# Patient Record
Sex: Male | Born: 1950 | Race: White | Hispanic: No | Marital: Single | Smoking: Never smoker
Health system: Southern US, Community
[De-identification: ages and names within clinical notes are randomized; demographics above are authoritative.]

## PROBLEM LIST (undated history)

## (undated) DIAGNOSIS — E785 Hyperlipidemia, unspecified: Secondary | ICD-10-CM

## (undated) DIAGNOSIS — R21 Rash and other nonspecific skin eruption: Secondary | ICD-10-CM

## (undated) DIAGNOSIS — N200 Calculus of kidney: Secondary | ICD-10-CM

## (undated) DIAGNOSIS — C801 Malignant (primary) neoplasm, unspecified: Secondary | ICD-10-CM

## (undated) DIAGNOSIS — Z860100 Personal history of colon polyps, unspecified: Secondary | ICD-10-CM

## (undated) DIAGNOSIS — B159 Hepatitis A without hepatic coma: Secondary | ICD-10-CM

## (undated) DIAGNOSIS — I350 Nonrheumatic aortic (valve) stenosis: Secondary | ICD-10-CM

## (undated) DIAGNOSIS — N419 Inflammatory disease of prostate, unspecified: Secondary | ICD-10-CM

## (undated) DIAGNOSIS — M549 Dorsalgia, unspecified: Secondary | ICD-10-CM

## (undated) DIAGNOSIS — I10 Essential (primary) hypertension: Secondary | ICD-10-CM

## (undated) HISTORY — DX: Essential (primary) hypertension: I10

## (undated) HISTORY — DX: Dorsalgia, unspecified: M54.9

## (undated) HISTORY — DX: Inflammatory disease of prostate, unspecified: N41.9

## (undated) HISTORY — DX: Hyperlipidemia, unspecified: E78.5

## (undated) HISTORY — DX: Rash and other nonspecific skin eruption: R21

## (undated) HISTORY — DX: Hepatitis a without hepatic coma: B15.9

## (undated) HISTORY — PX: OTHER SURGICAL HISTORY: SHX169

## (undated) HISTORY — DX: Nonrheumatic aortic (valve) stenosis: I35.0

## (undated) HISTORY — DX: Personal history of colonic polyps: Z86.010

## (undated) HISTORY — DX: Calculus of kidney: N20.0

## (undated) HISTORY — DX: Personal history of colon polyps, unspecified: Z86.0100

---

## 2004-05-12 ENCOUNTER — Encounter: Admission: RE | Admit: 2004-05-12 | Discharge: 2004-05-12 | Payer: Self-pay | Admitting: Internal Medicine

## 2007-04-26 ENCOUNTER — Ambulatory Visit: Payer: Self-pay | Admitting: Internal Medicine

## 2007-04-26 LAB — CONVERTED CEMR LAB
Albumin: 3.7 g/dL (ref 3.5–5.2)
Basophils Absolute: 0.1 10*3/uL (ref 0.0–0.1)
Bilirubin, Direct: 0.2 mg/dL (ref 0.0–0.3)
Calcium: 8.6 mg/dL (ref 8.4–10.5)
Cholesterol: 210 mg/dL (ref 0–200)
Eosinophils Absolute: 0.2 10*3/uL (ref 0.0–0.6)
Eosinophils Relative: 1.6 % (ref 0.0–5.0)
GFR calc Af Amer: 112 mL/min
GFR calc non Af Amer: 92 mL/min
Glucose, Bld: 122 mg/dL — ABNORMAL HIGH (ref 70–99)
HDL: 24.9 mg/dL — ABNORMAL LOW (ref >39.0)
Hemoglobin, Urine: NEGATIVE
Lymphocytes Relative: 30.5 % (ref 12.0–46.0)
MCHC: 34.7 g/dL (ref 30.0–36.0)
MCV: 85.6 fL (ref 78.0–100.0)
Neutro Abs: 5.9 10*3/uL (ref 1.4–7.7)
Neutrophils Relative %: 57.4 % (ref 43.0–77.0)
Nitrite: NEGATIVE
PSA: 0.99 ng/mL (ref 0.10–4.00)
Platelets: 327 10*3/uL (ref 150–400)
Sodium: 129 meq/L — ABNORMAL LOW (ref 135–145)
TSH: 3.05 microintl units/mL (ref 0.35–5.50)
Total CHOL/HDL Ratio: 8.4
Triglycerides: 363 mg/dL (ref 0–149)
Urine Glucose: NEGATIVE mg/dL
WBC: 10.4 10*3/uL (ref 4.5–10.5)

## 2007-05-05 ENCOUNTER — Ambulatory Visit: Payer: Self-pay | Admitting: Internal Medicine

## 2007-05-06 ENCOUNTER — Encounter: Payer: Self-pay | Admitting: Internal Medicine

## 2007-05-06 DIAGNOSIS — H9319 Tinnitus, unspecified ear: Secondary | ICD-10-CM | POA: Insufficient documentation

## 2007-05-06 DIAGNOSIS — Z860100 Personal history of colon polyps, unspecified: Secondary | ICD-10-CM | POA: Insufficient documentation

## 2007-05-06 DIAGNOSIS — J309 Allergic rhinitis, unspecified: Secondary | ICD-10-CM | POA: Insufficient documentation

## 2007-05-06 DIAGNOSIS — E782 Mixed hyperlipidemia: Secondary | ICD-10-CM

## 2007-05-06 DIAGNOSIS — B159 Hepatitis A without hepatic coma: Secondary | ICD-10-CM | POA: Insufficient documentation

## 2007-05-06 DIAGNOSIS — I10 Essential (primary) hypertension: Secondary | ICD-10-CM

## 2007-05-06 DIAGNOSIS — Z87442 Personal history of urinary calculi: Secondary | ICD-10-CM

## 2008-07-09 ENCOUNTER — Encounter (INDEPENDENT_AMBULATORY_CARE_PROVIDER_SITE_OTHER): Payer: Self-pay | Admitting: *Deleted

## 2008-07-09 ENCOUNTER — Ambulatory Visit: Payer: Self-pay | Admitting: Internal Medicine

## 2008-07-09 LAB — CONVERTED CEMR LAB
AST: 21 units/L (ref 0–37)
Alkaline Phosphatase: 60 units/L (ref 39–117)
Basophils Absolute: 0 10*3/uL (ref 0.0–0.1)
Basophils Relative: 0.4 % (ref 0.0–3.0)
CO2: 31 meq/L (ref 19–32)
Calcium: 9.1 mg/dL (ref 8.4–10.5)
Cholesterol: 225 mg/dL (ref 0–200)
Creatinine, Ser: 1 mg/dL (ref 0.4–1.5)
Glucose, Bld: 81 mg/dL (ref 70–99)
HCT: 49.4 % (ref 39.0–52.0)
Hemoglobin: 17 g/dL (ref 13.0–17.0)
Ketones, ur: NEGATIVE mg/dL
Lymphocytes Relative: 35.9 % (ref 12.0–46.0)
MCHC: 34.4 g/dL (ref 30.0–36.0)
MCV: 88.5 fL (ref 78.0–100.0)
Neutro Abs: 4.4 10*3/uL (ref 1.4–7.7)
RBC: 5.58 M/uL (ref 4.22–5.81)
Specific Gravity, Urine: 1.01 (ref 1.000–1.03)
TSH: 2.65 microintl units/mL (ref 0.35–5.50)
Total Bilirubin: 1 mg/dL (ref 0.3–1.2)
Total Protein, Urine: NEGATIVE mg/dL
Urine Glucose: NEGATIVE mg/dL
pH: 7 (ref 5.0–8.0)

## 2008-07-13 ENCOUNTER — Ambulatory Visit: Payer: Self-pay | Admitting: Internal Medicine

## 2008-12-14 ENCOUNTER — Ambulatory Visit: Payer: Self-pay | Admitting: Internal Medicine

## 2008-12-14 DIAGNOSIS — M549 Dorsalgia, unspecified: Secondary | ICD-10-CM | POA: Insufficient documentation

## 2008-12-14 DIAGNOSIS — R21 Rash and other nonspecific skin eruption: Secondary | ICD-10-CM

## 2009-01-21 ENCOUNTER — Telehealth: Payer: Self-pay | Admitting: Internal Medicine

## 2009-07-18 ENCOUNTER — Ambulatory Visit: Payer: Self-pay | Admitting: Internal Medicine

## 2009-07-18 LAB — CONVERTED CEMR LAB
AST: 17 units/L (ref 0–37)
BUN: 13 mg/dL (ref 6–23)
Basophils Absolute: 0.1 10*3/uL (ref 0.0–0.1)
Bilirubin, Direct: 0.1 mg/dL (ref 0.0–0.3)
Calcium: 9.1 mg/dL (ref 8.4–10.5)
Cholesterol: 196 mg/dL (ref 0–200)
Creatinine, Ser: 1 mg/dL (ref 0.4–1.5)
GFR calc non Af Amer: 81 mL/min (ref >60)
Glucose, Bld: 92 mg/dL (ref 70–99)
HCT: 50.9 % (ref 39.0–52.0)
HDL: 34.3 mg/dL — ABNORMAL LOW (ref >39.00)
LDL Cholesterol: 138 mg/dL — ABNORMAL HIGH (ref 0–99)
Leukocytes, UA: NEGATIVE
Lymphs Abs: 2.6 10*3/uL (ref 0.7–4.0)
Monocytes Relative: 9.9 % (ref 3.0–12.0)
Neutrophils Relative %: 58.7 % (ref 43.0–77.0)
PSA: 1.15 ng/mL (ref 0.10–4.00)
Platelets: 305 10*3/uL (ref 150.0–400.0)
RDW: 13.4 % (ref 11.5–14.6)
Specific Gravity, Urine: 1.015 (ref 1.000–1.030)
TSH: 1.55 microintl units/mL (ref 0.35–5.50)
Total Bilirubin: 1 mg/dL (ref 0.3–1.2)
Triglycerides: 119 mg/dL (ref 0.0–149.0)
Urine Glucose: NEGATIVE mg/dL
Urobilinogen, UA: 0.2 (ref 0.0–1.0)
VLDL: 23.8 mg/dL (ref 0.0–40.0)
WBC: 9.5 10*3/uL (ref 4.5–10.5)
pH: 6.5 (ref 5.0–8.0)

## 2009-07-23 ENCOUNTER — Ambulatory Visit: Payer: Self-pay | Admitting: Internal Medicine

## 2009-07-23 DIAGNOSIS — Z87448 Personal history of other diseases of urinary system: Secondary | ICD-10-CM

## 2010-07-17 ENCOUNTER — Ambulatory Visit: Payer: Self-pay | Admitting: Internal Medicine

## 2010-07-17 LAB — CONVERTED CEMR LAB
Albumin: 3.8 g/dL (ref 3.5–5.2)
Alkaline Phosphatase: 50 units/L (ref 39–117)
Basophils Absolute: 0.1 10*3/uL (ref 0.0–0.1)
Bilirubin, Direct: 0.1 mg/dL (ref 0.0–0.3)
CO2: 30 meq/L (ref 19–32)
Calcium: 9.5 mg/dL (ref 8.4–10.5)
Cholesterol: 207 mg/dL — ABNORMAL HIGH (ref 0–200)
Creatinine, Ser: 1 mg/dL (ref 0.4–1.5)
Direct LDL: 144.5 mg/dL
Eosinophils Absolute: 0.2 10*3/uL (ref 0.0–0.7)
Glucose, Bld: 89 mg/dL (ref 70–99)
Lymphocytes Relative: 36.6 % (ref 12.0–46.0)
MCHC: 35 g/dL (ref 30.0–36.0)
Neutrophils Relative %: 50.4 % (ref 43.0–77.0)
PSA: 1.3 ng/mL (ref 0.10–4.00)
Platelets: 377 10*3/uL (ref 150.0–400.0)
RDW: 14 % (ref 11.5–14.6)
Specific Gravity, Urine: 1.01 (ref 1.000–1.030)
Total Protein, Urine: NEGATIVE mg/dL
Triglycerides: 147 mg/dL (ref 0.0–149.0)
Urine Glucose: NEGATIVE mg/dL
Urobilinogen, UA: 0.2 (ref 0.0–1.0)

## 2010-07-24 ENCOUNTER — Ambulatory Visit: Payer: Self-pay | Admitting: Internal Medicine

## 2010-07-24 ENCOUNTER — Encounter: Payer: Self-pay | Admitting: Internal Medicine

## 2010-11-04 NOTE — Assessment & Plan Note (Signed)
Summary: PHYSICAL---STC   Vital Signs:  Patient profile:   60 year old male Height:      67 inches Weight:      133 pounds BMI:     20.91 O2 Sat:      98 % on Room air Temp:     98.3 degrees F oral Pulse rate:   81 / minute BP sitting:   126 / 88  (left arm) Cuff size:   regular  Vitals Entered By: Bill Salinas CMA (July 24, 2010 1:28 PM)  O2 Flow:  Room air CC: pt here for cpx   Primary Care Provider:  Jacques Navy MD  CC:  pt here for cpx.  History of Present Illness: Preesnts for general wellness exam.  He has known cervical arthritis and this has gotten worse. When he works with weights he has had several strains involving upper trunk and arm. He has not had c-spine x-rays for several years.  Balance - he seems to stumble more. He has some numbness in his toes, worse after exercise; feet get cold.  ED-decrease volume of ejactulate, no difficulty maintaining an erection, no libido problem.  Current Medications (verified): 1)  Lisinopril-Hydrochlorothiazide 20-25 Mg  Tabs (Lisinopril-Hydrochlorothiazide) .... Take 1 Tablet By Mouth Once A Day 2)  Aspirin 81 Mg  Tabs (Aspirin) .... Take 1 Tablet By Mouth Once A Day 3)  Saw Palmetto 450 Mg Caps (Saw Palmetto (Serenoa Repens)) .... Take 1 Tablet By Mouth Once A Day 4)  Fish Oil 1200 Mg Caps (Omega-3 Fatty Acids) .... Take 1 Tablet By Mouth Once A Day 5)  Multivitamins   Tabs (Multiple Vitamin) .... Take 1 Tablet By Mouth Once A Day 6)  Hydroxyzine Hcl 25 Mg Tabs (Hydroxyzine Hcl) .... As Directed As Needed 7)  Doxepin Hcl 25 Mg Caps (Doxepin Hcl) .... Take 1 Tab By Mouth At Bedtime As Needed 8)  Nortriptyline Hcl 25 Mg Caps (Nortriptyline Hcl) .... As Directed As Needed 9)  Niacin Cr 250 Mg Cr-Caps (Niacin) .Marland Kitchen.. 1 By Mouth Once Daily 10)  Vitamin D3 1000 Unit Tabs (Cholecalciferol) .Marland Kitchen.. 1 By Mouth Once Daily 11)  Flexeril 5 Mg Tabs (Cyclobenzaprine Hcl) .Marland Kitchen.. 1 By Mouth Three Times A Day As Needed - Needs Return  Office Visit With Dr Christen Butter For Further Refills 12)  Diclofenac Sodium 75 Mg Tbec (Diclofenac Sodium) .Marland Kitchen.. 1 By Mouth Two Times A Day As Needed 13)  Cyclobenzaprine Hcl 10 Mg Tabs (Cyclobenzaprine Hcl) .... Prn 14)  Doxycycline Hyclate 50 Mg Caps (Doxycycline Hyclate) .... Prn 15)  Glucosamine 500 Mg Caps (Glucosamine Sulfate) .Marland Kitchen.. 1 Capsule Two Times A Day  Allergies (verified): 1)  ! Pcn 2)  ! 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) COLONIC POLYPS, HX OF (ICD-V12.72) NEPHROLITHIASIS, HX OF (ICD-V13.01) HYPERTENSION (ICD-401.9) ALLERGIC RHINITIS (ICD-477.9)  Past Surgical History: Last updated: 08/03/2009 ORIF wrist-'05 correction deviated septum '05 ureteroscopyp-in his 20's  Family History: Last updated: 2009-08-03 mother - deceased  47: CHF, lipids, HTN, CVA father - deceased @ 2: c/o DM, CVA, ESRD-HD, amputee  Family History Hypertension Neg- colon or prostate; MI  Social History: Last updated: Aug 03, 2009 [employer]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, 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    Physical Exam  General:  slender white  male in no distress Head:  Normocephalic and atraumatic without obvious abnormalities. No apparent alopecia or balding. Eyes:  No corneal or conjunctival inflammation noted. EOMI. Perrla. Funduscopic exam benign, without hemorrhages, exudates or papilledema. Vision grossly normal. Ears:  External ear exam shows no significant lesions or deformities.  Otoscopic examination reveals clear canals, tympanic membranes are intact bilaterally without bulging, retraction, inflammation or discharge. Hearing is grossly normal bilaterally. Nose:  no external deformity and no external erythema.   Mouth:  Oral mucosa and oropharynx without lesions or exudates.  Teeth in good repair. Neck:  supple, full ROM, no thyromegaly, and no carotid bruits.   Chest Wall:  no deformities and no masses.   Lungs:  Normal respiratory effort, chest expands symmetrically. Lungs are clear to auscultation, no crackles or wheezes. Heart:  Normal rate and regular rhythm. S1 and S2 normal without gallop, murmur, click, rub or other extra sounds. Abdomen:  soft, non-tender, normal bowel sounds, no masses, no guarding, and no hepatomegaly.   Rectal:  No external abnormalities noted. Normal sphincter tone. No rectal masses or tenderness. Genitalia:  no hydrocele, no varicocele, no scrotal masses, and no testicular masses or atrophy.   Prostate:  Prostate gland firm and smooth, no enlargement, nodularity, tenderness, mass, asymmetry or induration. Msk:  normal ROM, no joint tenderness, no joint swelling, no joint warmth, and no joint instability.   Pulses:  2+ radial and DP Extremities:  No clubbing, cyanosis, edema, or deformity noted with normal full range of motion of all joints.   Neurologic:  alert & oriented X3, cranial nerves II-XII intact, strength normal in all extremities, gait normal, and DTRs symmetrical and normal.   Skin:  turgor normal, color normal, no rashes, no suspicious lesions, and no ulcerations.   Cervical Nodes:   no anterior cervical adenopathy and no posterior cervical adenopathy.   Inguinal Nodes:  no R inguinal adenopathy and no L inguinal adenopathy.   Psych:  Oriented X3, memory intact for recent and remote, normally interactive, good eye contact, and not anxious appearing.     Impression & Recommendations:  Problem # 1:  HYPERLIPIDEMIA, MIXED (ICD-272.2) Patient on no statin drugs using only otc niacin.  Plan - lab with recommnedations to follow.  His updated medication list for this problem includes:    Niacin Cr 250 Mg Cr-caps (Niacin) .Marland Kitchen... 1 by mouth once daily  Addendum - LDL mildly elevated at 144.5, with NCEP treatment threshold of 160.  Plan - life-style management with a low fat diet and regular aerobic exercise.           Repeat lipid profile in 6 months.  Problem # 2:  HYPERTENSION (ICD-401.9)  His updated medication list for this problem includes:    Lisinopril-hydrochlorothiazide 20-25 Mg Tabs (Lisinopril-hydrochlorothiazide) .Marland Kitchen... Take 1 tablet by mouth once a day  BP today: 126/88 Prior BP: 100/64 (07/23/2009)  Good control on present medications. Will continue the same.  Problem # 3:  BACK PAIN (ICD-724.5)  Patient with upper back pain. No radicular findings on exam.  Plan - evaluate for degenerative changes cervical spine  continue present medications as needed.  His updated medication list for this problem includes:    Aspirin 81 Mg Tabs (Aspirin) .Marland Kitchen... Take 1 tablet by mouth once a day    Flexeril 5 Mg Tabs (Cyclobenzaprine hcl) .Marland Kitchen... 1 by mouth three times a day as needed - needs return office visit with dr Christen Butter for further refills    Diclofenac Sodium 75 Mg Tbec (Diclofenac sodium) .Marland Kitchen... 1 by mouth two times a day as needed    Cyclobenzaprine Hcl 10 Mg Tabs (Cyclobenzaprine hcl) .Marland Kitchen... Prn  G CERVICAL SPINE WITH FLEX & EXTEND - [id number]   Clinical Data: Right side neck pain.   CERVICAL SPINE COMPLETE WITH FLEXION AND EXTENSION VIEWS     Comparison: None.   Findings: There is marked loss of disc space height at C3-4, C5-6 and C6-7.  Mild anterolisthesis of C4 on C5 of 0.4 cm is seen with flexion which reduces with extension.  Mild retrolisthesis of C3 on C4 is unchanged with flexion or extension.  Vertebral body height is maintained.  The patient has multilevel uncovertebral disease. Prevertebral soft tissues appear normal.  Lung apices are clear.   IMPRESSION:   1.  No acute finding. 2.  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   Read By:  Charyl Dancer,  M.D.  Orders: T-Cervical Spine Comp w/Flex & Ext ([id number])  Problem # 4:  Preventive Health Care (ICD-V70.0) Unremarkable interval history. Normal physical exam including normal testicles. No problems with sexual function except decreased volume of ejaculate which does not require medical treatment. Lab results at within normal limits except for mild elevation in LDL cholesterol as noted. Last colonoscopy Jan '07 with follow-up due 2017. Immunizations: tetnus Jan '02, pneumonvax Oct '09 with booster due in 2014. He should check insurance coverage for shingles vaccine.12 Lead EKG negative for any signs of ischemia  In summary - a nice man who appears to be medically stable at this time. He will return as needed or in 1 year.    Complete Medication List: 1)  Lisinopril-hydrochlorothiazide 20-25 Mg Tabs (Lisinopril-hydrochlorothiazide) .... Take 1 tablet by mouth once a day 2)  Aspirin 81 Mg Tabs (Aspirin) .... Take 1 tablet by mouth once a day 3)  Saw Palmetto 450 Mg Caps (Saw palmetto (serenoa repens)) .... Take 1 tablet by mouth once a day 4)  Fish Oil 1200 Mg Caps (Omega-3 fatty acids) .... Take 1 tablet by mouth once a day 5)  Multivitamins Tabs (Multiple vitamin) .... Take 1 tablet by mouth once a day 6)  Hydroxyzine Hcl 25 Mg Tabs (Hydroxyzine hcl) .... As directed as needed 7)  Doxepin Hcl 25 Mg Caps (Doxepin hcl) .... Take  1 tab by mouth at bedtime as needed 8)  Nortriptyline Hcl 25 Mg Caps (Nortriptyline hcl) .... As directed as needed 9)  Niacin Cr 250 Mg Cr-caps (Niacin) .Marland Kitchen.. 1 by mouth once daily 10)  Vitamin D3 1000 Unit Tabs (Cholecalciferol) .Marland Kitchen.. 1 by mouth once daily 11)  Flexeril 5 Mg Tabs (Cyclobenzaprine hcl) .Marland Kitchen.. 1 by mouth three times a day as needed - needs return office visit with dr Christen Butter for further refills 12)  Diclofenac Sodium 75 Mg Tbec (Diclofenac sodium) .Marland Kitchen.. 1 by mouth two times a day as needed 13)  Cyclobenzaprine Hcl 10 Mg Tabs (Cyclobenzaprine hcl) .... Prn 14)  Doxycycline Hyclate 50 Mg Caps (Doxycycline hyclate) .... Prn 15)  Glucosamine 500 Mg Caps (Glucosamine sulfate) .Marland Kitchen.. 1 capsule two times a  day  Other Orders: Admin 1st Vaccine (19147) Flu Vaccine 85yrs + (82956) EKG w/ Interpretation (93000)   Patient: Jonathan Miranda Note: All result statuses are Final unless otherwise noted.  Tests: (1) Lipid Panel (LIPID)   Cholesterol          [H]  207 mg/dL                   2-130     ATP III Classification            Desirable:  < 200 mg/dL                    Borderline High:  200 - 239 mg/dL               High:  > = 240 mg/dL   Triglycerides             147.0 mg/dL                 8.6-578.4     Normal:  <150 mg/dL     Borderline High:  696 - 199 mg/dL   HDL                  [L]  29.52 mg/dL                 >84.13   VLDL Cholesterol          29.4 mg/dL                  2.4-40.1  CHO/HDL Ratio:  CHD Risk                             5                    Men          Women     1/2 Average Risk     3.4          3.3     Average Risk          5.0          4.4     2X Average Risk          9.6          7.1     3X Average Risk          15.0          11.0                           Tests: (2) BMP (METABOL)   Sodium               [L]  128 mEq/L                   135-145   Potassium                 4.4 mEq/L                   3.5-5.1   Chloride             [L]  90 mEq/L                     96-112   Carbon Dioxide  30 mEq/L                    19-32   Glucose                   89 mg/dL                    98-11   BUN                       9 mg/dL                     9-14   Creatinine                1.0 mg/dL                   7.8-2.9   Calcium                   9.5 mg/dL                   5.6-21.3   GFR                       82.64 mL/min                >60  Tests: (3) CBC Platelet w/Diff (CBCD)   White Cell Count          9.4 K/uL                    4.5-10.5   Red Cell Count            4.98 Mil/uL                 4.22-5.81   Hemoglobin                15.5 g/dL                   08.6-57.8   Hematocrit                44.4 %                      39.0-52.0   MCV                       89.3 fl                     78.0-100.0   MCHC                      35.0 g/dL                   46.9-62.9   RDW                       14.0 %                      11.5-14.6   Platelet Count            377.0 K/uL                  150.0-400.0   Neutrophil %              50.4 %  43.0-77.0   Lymphocyte %              36.6 %                      12.0-46.0   Monocyte %                10.6 %                      3.0-12.0   Eosinophils%              1.7 %                       0.0-5.0   Basophils %               0.7 %                       0.0-3.0   Neutrophill Absolute      4.7 K/uL                    1.4-7.7   Lymphocyte Absolute       3.4 K/uL                    0.7-4.0   Monocyte Absolute         1.0 K/uL                    0.1-1.0  Eosinophils, Absolute                             0.2 K/uL                    0.0-0.7   Basophils Absolute        0.1 K/uL                    0.0-0.1  Tests: (4) Hepatic/Liver Function Panel (HEPATIC)   Total Bilirubin           0.6 mg/dL                   5.7-8.4   Direct Bilirubin          0.1 mg/dL                   6.9-6.2   Alkaline Phosphatase      50 U/L                      39-117   AST                       23 U/L                       0-37   ALT                       18 U/L                      0-53   Total Protein             6.2 g/dL                    9.5-2.8  Albumin                   3.8 g/dL                    1.1-9.1  Tests: (5) TSH (TSH)   FastTSH                   4.14 uIU/mL                 0.35-5.50  Tests: (6) UDip Only (UDIP)   Color                     Yellow       RANGE:  Yellow;Lt. Yellow   Clarity                   CLEAR                       Clear   Specific Gravity          1.010                       1.000 - 1.030   Urine Ph                  7.0                         5.0-8.0   Protein                   NEGATIVE                    Negative   Urine Glucose             NEGATIVE                    Negative   Ketones                   NEGATIVE                    Negative   Urine Bilirubin           NEGATIVE                    Negative   Blood                     NEGATIVE                    Negative   Urobilinogen              0.2                         0.0 - 1.0   Leukocyte Esterace        NEGATIVE                    Negative   Nitrite                   NEGATIVE                    Negative  Tests: (7) Prostate Specific Antigen (PSA)   PSA-Hyb  1.30 ng/mL                  0.10-4.00  Tests: (8) Cholesterol LDL - Direct (DIRLDL)  Cholesterol LDL - Direct                             144.5 mg/dL     Optimal:  <846 mg/dL     Near or Above Optimal:  100-129 mg/dL     Borderline High:  962-952 mg/dL     High:  841-324 mg/dL     Very High:  >401 mg/dL  Orders Added: 1)  Admin 1st Vaccine [90471] 2)  Flu Vaccine 62yrs + [02725] 3)  T-Cervical Spine Comp w/Flex & Ext [72052TC] 4)  Est. Patient 40-64 years [99396] 5)  EKG w/ Interpretation [93000]   Flu Vaccine Consent Questions     Do you have a history of severe allergic reactions to this vaccine? no    Any prior history of allergic reactions to egg and/or gelatin? no    Do you have a sensitivity to the preservative  Thimersol? no    Do you have a past history of Guillan-Barre Syndrome? no    Do you currently have an acute febrile illness? no    Have you ever had a severe reaction to latex? no    Vaccine information given and explained to patient? yes    Are you currently pregnant? no    Lot Number:[id number]   Exp Date:04/04/2011   Site Given  right Deltoid [id number]

## 2011-01-04 ENCOUNTER — Other Ambulatory Visit: Payer: Self-pay | Admitting: Internal Medicine

## 2011-02-03 ENCOUNTER — Other Ambulatory Visit: Payer: Self-pay | Admitting: Internal Medicine

## 2011-02-05 ENCOUNTER — Other Ambulatory Visit: Payer: Self-pay | Admitting: Internal Medicine

## 2011-04-09 ENCOUNTER — Telehealth: Payer: Self-pay | Admitting: *Deleted

## 2011-04-09 MED ORDER — ZOSTER VACCINE LIVE 19400 UNT/0.65ML ~~LOC~~ SOLR: 0.6500 mL | Freq: Once | SUBCUTANEOUS | Status: DC

## 2011-04-09 NOTE — Telephone Encounter (Signed)
oky dokey

## 2011-04-09 NOTE — Telephone Encounter (Signed)
Pt says ins will cover zostavax at [facility]. OK for RX?

## 2011-04-09 NOTE — Telephone Encounter (Signed)
Rx sent in - left vm for pt.

## 2011-05-11 ENCOUNTER — Other Ambulatory Visit: Payer: Self-pay | Admitting: Internal Medicine

## 2011-06-14 ENCOUNTER — Other Ambulatory Visit: Payer: Self-pay | Admitting: Internal Medicine

## 2011-07-12 ENCOUNTER — Other Ambulatory Visit: Payer: Self-pay | Admitting: Internal Medicine

## 2011-07-21 ENCOUNTER — Other Ambulatory Visit (INDEPENDENT_AMBULATORY_CARE_PROVIDER_SITE_OTHER): Payer: Self-pay

## 2011-07-21 ENCOUNTER — Other Ambulatory Visit: Payer: Self-pay | Admitting: Internal Medicine

## 2011-07-21 ENCOUNTER — Other Ambulatory Visit: Payer: Self-pay

## 2011-07-21 DIAGNOSIS — Z0000 Encounter for general adult medical examination without abnormal findings: Secondary | ICD-10-CM

## 2011-07-21 LAB — BASIC METABOLIC PANEL
CO2: 28 mEq/L (ref 19–32)
Chloride: 88 mEq/L — ABNORMAL LOW (ref 96–112)
GFR: 101.17 mL/min (ref >60.00)
Glucose, Bld: 89 mg/dL (ref 70–99)
Potassium: 4.7 mEq/L (ref 3.5–5.1)
Sodium: 125 mEq/L — ABNORMAL LOW (ref 135–145)

## 2011-07-21 LAB — CBC WITH DIFFERENTIAL/PLATELET
Basophils Relative: 0.4 % (ref 0.0–3.0)
Eosinophils Absolute: 0.2 10*3/uL (ref 0.0–0.7)
HCT: 45.4 % (ref 39.0–52.0)
Hemoglobin: 15.3 g/dL (ref 13.0–17.0)
Lymphocytes Relative: 34.4 % (ref 12.0–46.0)
MCHC: 33.7 g/dL (ref 30.0–36.0)
Monocytes Relative: 12.3 % — ABNORMAL HIGH (ref 3.0–12.0)
Neutro Abs: 4.7 10*3/uL (ref 1.4–7.7)
RBC: 5.09 Mil/uL (ref 4.22–5.81)

## 2011-07-21 LAB — HEPATIC FUNCTION PANEL
ALT: 26 U/L (ref 0–53)
AST: 28 U/L (ref 0–37)
Albumin: 4.1 g/dL (ref 3.5–5.2)
Alkaline Phosphatase: 61 U/L (ref 39–117)

## 2011-07-21 LAB — LIPID PANEL: VLDL: 22.8 mg/dL (ref 0.0–40.0)

## 2011-07-21 LAB — LDL CHOLESTEROL, DIRECT: Direct LDL: 159.3 mg/dL

## 2011-07-24 ENCOUNTER — Encounter: Payer: Self-pay | Admitting: Internal Medicine

## 2011-07-27 ENCOUNTER — Ambulatory Visit (INDEPENDENT_AMBULATORY_CARE_PROVIDER_SITE_OTHER): Payer: BC Managed Care – PPO | Admitting: Internal Medicine

## 2011-07-27 ENCOUNTER — Other Ambulatory Visit (INDEPENDENT_AMBULATORY_CARE_PROVIDER_SITE_OTHER): Payer: BC Managed Care – PPO

## 2011-07-27 VITALS — BP 118/78 | HR 80 | Temp 97.2°F | Wt 132.0 lb

## 2011-07-27 DIAGNOSIS — R270 Ataxia, unspecified: Secondary | ICD-10-CM

## 2011-07-27 DIAGNOSIS — R279 Unspecified lack of coordination: Secondary | ICD-10-CM

## 2011-07-27 DIAGNOSIS — Z87442 Personal history of urinary calculi: Secondary | ICD-10-CM

## 2011-07-27 DIAGNOSIS — Z0000 Encounter for general adult medical examination without abnormal findings: Secondary | ICD-10-CM

## 2011-07-27 DIAGNOSIS — R319 Hematuria, unspecified: Secondary | ICD-10-CM

## 2011-07-27 DIAGNOSIS — I10 Essential (primary) hypertension: Secondary | ICD-10-CM

## 2011-07-27 DIAGNOSIS — E782 Mixed hyperlipidemia: Secondary | ICD-10-CM

## 2011-07-27 DIAGNOSIS — J309 Allergic rhinitis, unspecified: Secondary | ICD-10-CM

## 2011-07-27 LAB — VITAMIN B12: Vitamin B-12: 735 pg/mL (ref 211–911)

## 2011-07-27 NOTE — Progress Notes (Signed)
Subjective:    Patient ID: Jonathan Miranda, male    DOB: [birth year], 60 y.o.   MRN: [id number]  HPI Jonathan Miranda presaents for routine annual exam. His primary problem is arthritis for which he takes diclofenac and flexeril. His primary location is neck and shoulders. Reviewed images and reports of c-spine series - spondylosis C3-4,4-5,5-6 with mild DDD. He reports that he is having balance and gait problems especially on uneven ground. He also reports a episode hematuria that was painless. He otherwise has been doing well.   Past Medical History  Diagnosis Date  . Prostatitis   . Rash   . Back pain   . Hyperlipidemia   . Hep A w/o coma   . Tinnitus   . Personal history of colonic polyps   . Nephrolithiasis   . Hypertension   . Allergic rhinitis    Past Surgical History  Procedure Date  . Orif wrist- '05   . Correction deviated septum   . Ureteroscopyp    Family History  Problem Relation Age of Onset  . Heart disease Mother   . Hyperlipidemia Mother   . Hypertension Mother   . Diabetes Father    History   Social History  . Marital Status: Single    Spouse Name: N/A    Number of Children: N/A  . Years of Education: N/A   Occupational History  . Not on file.   Social History Main Topics  . Smoking status: Not on file  . Smokeless tobacco: Not on file  . Alcohol Use:   . Drug Use:   . Sexually Active:    Other Topics Concern  . 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  HEENT:  Negative for hearing loss, ear pain, congestion,  and postnasal drip. Negative for sore throat or swallowing problems. Negative for dental complaints.   Eyes: Negative for vision loss or change in visual acuity.  Respiratory: Negative for chest  tightness and wheezing. Negative for DOE.   Cardiovascular: Negative for chest pain or palpitations. No decreased exercise tolerance Gastrointestinal: No change in bowel habit. No bloating or gas. No reflux or indigestion Genitourinary: Negative for urgency, frequency, flank pain and difficulty urinating. Reports single episode of painless hematuria Musculoskeletal: Negative for myalgias, back pain, arthralgias. Positive for neck pain and gait problem.  Neurological: Negative for dizziness, tremors, weakness and headaches.  Hematological: Negative for adenopathy.  Psychiatric/Behavioral: Negative for behavioral problems and dysphoric mood.       Objective:   Physical Exam Vital signs reviewed Gen'l: Well nourished well developed white male in no acute distress  HEENT: Head: Normocephalic and atraumatic. Right Ear: External ear normal. EAC/TM nl. Left Ear: External ear normal.  EAC/TM nl. Nose: Nose normal. Mouth/Throat: Oropharynx is clear and moist. Dentition - native, in good repair. No buccal or palatal lesions. Posterior pharynx clear. Eyes: Conjunctivae and sclera clear. EOM intact. Pupils are equal, round, and reactive to light. Right eye exhibits no discharge. Left eye exhibits no discharge. Neck: Normal range of motion. Neck supple. No JVD present. No tracheal deviation present. No thyromegaly present.  Cardiovascular: Normal rate, regular rhythm, no gallop, no friction rub, no murmur heard.      Quiet precordium. 2+ radial and DP pulses . No carotid  bruits Pulmonary/Chest: Effort normal. No respiratory distress or increased WOB, no wheezes, no rales. No chest wall deformity or CVAT. Abdominal: Soft. Bowel sounds are normal in all quadrants. He exhibits no distension, no tenderness, no rebound or guarding, No heptosplenomegaly  Genitourinary:  deferred Musculoskeletal: Normal range of motion. He exhibits no edema and no tenderness.       Small and large joints without redness, synovial  thickening or deformity. Full range of motion preserved about all small, median and large joints.  Lymphadenopathy:    He has no cervical or supraclavicular adenopathy.  Neurological: He is alert and oriented to person, place, and time. CN II-XII intact. DTRs 2+ and symmetrical biceps, radial and patellar tendons. Cerebellar function normal with no tremor, rigidity, normal gait and station.  Skin: Skin is warm and dry. No rash noted. No erythema.  Psychiatric: He has a normal mood and affect. His behavior is normal. Thought content normal.   Lab Results  Component Value Date   WBC 9.3 07/21/2011   HGB 15.3 07/21/2011   HCT 45.4 07/21/2011   PLT 388.0 07/21/2011   GLUCOSE 89 07/21/2011   CHOL 222* 07/21/2011   TRIG 114.0 07/21/2011   HDL 41.00 07/21/2011   LDLDIRECT 159.3 07/21/2011   LDLCALC 138* 07/18/2009   ALT 26 07/21/2011   AST 28 07/21/2011   NA 125* 07/21/2011   K 4.7 07/21/2011   CL 88* 07/21/2011   CREATININE 0.8 07/21/2011   BUN 12 07/21/2011   CO2 28 07/21/2011   TSH 2.86 07/21/2011   PSA 1.23 07/21/2011            Assessment & Plan:

## 2011-07-28 ENCOUNTER — Encounter: Payer: Self-pay | Admitting: Internal Medicine

## 2011-07-28 ENCOUNTER — Telehealth: Payer: Self-pay | Admitting: Internal Medicine

## 2011-07-28 DIAGNOSIS — Z0000 Encounter for general adult medical examination without abnormal findings: Secondary | ICD-10-CM | POA: Insufficient documentation

## 2011-07-28 NOTE — Assessment & Plan Note (Signed)
Lipid panel with elevated LDL cholesterol. Discussed with the patient and he prefers to not take medication due to previous intolerance of a variety of drugs, both statins and others. He has a low cardiac risk profile. He does understand that untreated this does constitute a risk factor.  Plan - life-style management with close attention to a low fat diet and a need for increased aerobic exercise.

## 2011-07-28 NOTE — Assessment & Plan Note (Signed)
By x-ray, both report and images reviewed, he has significant DJD and DDD. He is having neck pain which is intermittent. He is also having gait and balance problems, especially on an uneven surface. ON exam he is unable to perform tandem gait. Suspect this is of a cervical origin, possibly spinal stenosis C-spine.  Plan - MRI cervical spine to r/o spinal stenosis           Referral to [facility] - Rx provided and he is to make his own appointment.

## 2011-07-28 NOTE — Assessment & Plan Note (Signed)
BP Readings from Last 3 Encounters:  07/27/11 118/78  07/24/10 126/88  07/23/09 100/64   Good control on ACE/HCT. Reviewed labs going back several years - he has a consistently low sodium. He tolerates this without symptoms. This may be related to the diuretic in his regimen. With no change in sodium level and without symptoms will make no change in medical regimen.

## 2011-07-28 NOTE — Assessment & Plan Note (Signed)
Interval hisotry significant for neck pain, gait problems and hematuria. Physical exam is unremarkable. Lab results notable for sodium of 125 (low), LDL 159, B12 normal. He is current with colorectal cancer screening due for follow-up study in 2017. Immunizations: Tetanus Jan '02 - due; shingles vaccine current Oct '11; pneumonia vaccine - due; flu shot today. 12 lead EKG w/o ischemia or injury.  In summary - a nice man who appears to be doing well but does need to address hematuria (referral in process). He also has chosen life-style management only for hyperlipidemia and will need a careful low fat diet and increased aerobic exercise. He will be notified of his MRI C-spine when results available. He will return as needed or in one year.

## 2011-07-28 NOTE — Assessment & Plan Note (Signed)
C/o post-nasal drainage, on exam no evidence of infection.  Plan - otc non-sedating antihistamine

## 2011-07-28 NOTE — Telephone Encounter (Signed)
Call pt B12 - normal

## 2011-07-28 NOTE — Assessment & Plan Note (Signed)
Patient with a h/o nephrolithiasis and he reports that he has had stones without pain. He does report a single episode of painless hematuria. At his age he is a candidate for urologic evaluation to r/o bladder neoplasm or other  Pathologic cause of hematuria.  Plan - referral to urology

## 2011-07-29 NOTE — Telephone Encounter (Signed)
Pt informed B12 normal

## 2011-08-06 ENCOUNTER — Other Ambulatory Visit: Payer: Self-pay | Admitting: Internal Medicine

## 2011-08-13 ENCOUNTER — Ambulatory Visit
Admission: RE | Admit: 2011-08-13 | Discharge: 2011-08-13 | Disposition: A | Discharge status: Home / Self Care | Payer: BC Managed Care – PPO | Source: Ambulatory Visit | Attending: Internal Medicine | Admitting: Internal Medicine

## 2011-08-13 DIAGNOSIS — R270 Ataxia, unspecified: Secondary | ICD-10-CM

## 2011-09-15 ENCOUNTER — Other Ambulatory Visit: Payer: Self-pay | Admitting: Internal Medicine

## 2011-09-30 ENCOUNTER — Other Ambulatory Visit: Payer: Self-pay | Admitting: *Deleted

## 2011-09-30 MED ORDER — DICLOFENAC SODIUM 75 MG PO TBEC: 75.0000 mg | DELAYED_RELEASE_TABLET | Freq: Two times a day (BID) | ORAL | Status: DC

## 2012-01-07 ENCOUNTER — Ambulatory Visit (INDEPENDENT_AMBULATORY_CARE_PROVIDER_SITE_OTHER): Payer: BC Managed Care – PPO | Admitting: Internal Medicine

## 2012-01-07 ENCOUNTER — Encounter: Payer: Self-pay | Admitting: Internal Medicine

## 2012-01-07 VITALS — BP 132/88 | HR 89 | Temp 98.3°F | Resp 16 | Wt 136.0 lb

## 2012-01-07 DIAGNOSIS — Z4802 Encounter for removal of sutures: Secondary | ICD-10-CM

## 2012-01-10 NOTE — Progress Notes (Signed)
  Subjective:    Patient ID: Jonathan Miranda, male    DOB: 09/01/1951, 61 y.o.   MRN: [id number]  HPI Jonathan Miranda was at [facility] and took a fall sustaining a laceration to the frontal scalp and a stellate laceration to the left occiput. He was seen at an ED in [place], [place]. Evaluation was negative. The front scalp lac was clued closed. The occipital gash was sutured with 4  Interrupted sutures [place] ing 4-0 ethilon. He has has done well and presents today for suture removal. He denies headache, change in vision or other neuro symptoms.  PMH, FamHx and SocHx reviewed for any changes and relevance.  Current Outpatient Prescriptions on File Prior to Visit  Medication Sig Dispense Refill  . cyclobenzaprine (FLEXERIL) 10 MG tablet TAKE 1 TABLET BY MOUTH THREE TIMES DAILY AS NEEDED  60 tablet  2  . diclofenac (VOLTAREN) 75 MG EC tablet Take 1 tablet (75 mg total) by mouth 2 (two) times daily.  60 tablet  2  . doxycycline (VIBRAMYCIN) 100 MG capsule Take 100 mg by mouth as needed.        . hydrOXYzine (ATARAX) 25 MG tablet TAKE AS DIRECTED AS NEEDED  30 tablet  0  . lisinopril-hydrochlorothiazide (PRINZIDE,ZESTORETIC) 20-25 MG per tablet TAKE 1 TABLET BY MOUTH EVERY DAY  30 tablet  5      Review of Systems System review is negative for any constitutional, cardiac, pulmonary, GI or neuro symptoms or complaints other than as described in the HPI.     Objective:   Physical Exam Filed Vitals:   01/07/12 1145  BP: 132/88  Pulse: 89  Temp: 98.3 F (36.8 C)  Resp: 16   Derm - well healed laceration left frontal scalp. Healed stellate laceration occiput with mild amount of eschar/coagulum. 4 sutures identified and removed without difficulty.        Assessment & Plan:  Suture removal - uncomplicate. Wound looks ok, w/o signs of infection.

## 2012-02-09 ENCOUNTER — Other Ambulatory Visit: Payer: Self-pay | Admitting: *Deleted

## 2012-02-09 MED ORDER — LISINOPRIL-HYDROCHLOROTHIAZIDE 20-25 MG PO TABS: 1.0000 | ORAL_TABLET | Freq: Every day | ORAL | Status: DC

## 2012-04-05 ENCOUNTER — Other Ambulatory Visit: Payer: Self-pay | Admitting: *Deleted

## 2012-04-05 MED ORDER — DICLOFENAC SODIUM 75 MG PO TBEC: 75.0000 mg | DELAYED_RELEASE_TABLET | Freq: Two times a day (BID) | ORAL | Status: DC

## 2012-04-05 NOTE — Telephone Encounter (Signed)
Refill Rx to [facility]  voltaren

## 2012-04-25 ENCOUNTER — Other Ambulatory Visit: Payer: Self-pay | Admitting: *Deleted

## 2012-04-25 MED ORDER — HYDROXYZINE HCL 25 MG PO TABS: 25.0000 mg | ORAL_TABLET | Freq: Four times a day (QID) | ORAL | Status: DC | PRN

## 2012-04-27 ENCOUNTER — Ambulatory Visit (INDEPENDENT_AMBULATORY_CARE_PROVIDER_SITE_OTHER): Payer: BC Managed Care – PPO | Admitting: Internal Medicine

## 2012-04-27 ENCOUNTER — Encounter: Payer: Self-pay | Admitting: Internal Medicine

## 2012-04-27 VITALS — BP 120/80 | HR 89 | Temp 98.3°F | Ht 67.0 in | Wt 133.5 lb

## 2012-04-27 DIAGNOSIS — R21 Rash and other nonspecific skin eruption: Secondary | ICD-10-CM

## 2012-04-27 MED ORDER — METHYLPREDNISOLONE ACETATE 80 MG/ML IJ SUSP
120.0000 mg | Freq: Once | INTRAMUSCULAR | Status: AC
  Administered 2012-04-27: 120 mg via INTRAMUSCULAR

## 2012-04-27 MED ORDER — TRIAMCINOLONE ACETONIDE 0.1 % EX CREA
TOPICAL_CREAM | Freq: Two times a day (BID) | CUTANEOUS | Status: DC
Start: 2012-04-27 — End: 2012-09-20

## 2012-04-27 MED ORDER — HYDROXYZINE HCL 25 MG PO TABS: 25.0000 mg | ORAL_TABLET | Freq: Four times a day (QID) | ORAL | Status: DC | PRN

## 2012-04-27 MED ORDER — PREDNISONE 10 MG PO TABS: 10.0000 mg | ORAL_TABLET | Freq: Every day | ORAL | Status: DC

## 2012-04-27 NOTE — Patient Instructions (Addendum)
You had the steroid shot today Take all new medications as prescribed - the prednisone, and topical steroid cream Your hydroxyzine was refilled as you requested This treatment should drastically improve the rash and itching, but please plan to see Dr Margo Aye if it tries to re-occur

## 2012-04-30 ENCOUNTER — Encounter: Payer: Self-pay | Admitting: Internal Medicine

## 2012-04-30 NOTE — Assessment & Plan Note (Signed)
?   Etiology, d/w dermatitis - for depomedrol IM, predpack and topcial steroid, for atarax prn, as well - pt plans to f/u with dermatology, declines referral

## 2012-04-30 NOTE — Progress Notes (Signed)
  Subjective:    Patient ID: Jonathan Miranda, male    DOB: 15-Jun-1951, 61 y.o.   MRN: [id number]  HPI  Here with 1 month gradually worsening rash to post neck and occipital area of head, now with marked itching and some swelling to the post neck aspect,  Burning and itching with some scaliness to the neck aspect and flakiness, without fever, trauma, or hx of same.  Pt denies chest pain, increased sob or doe, wheezing, orthopnea, PND, increased LE swelling, palpitations, dizziness or syncope.   Pt denies polydipsia, polyuria.   Pt denies fever, wt loss, night sweats, loss of appetite, or other constitutional symptoms.  Has seen dermatology but could not be seen quickly so here today, needs refill atarax. Past Medical History  Diagnosis Date  . Prostatitis   . Rash   . Back pain   . Hyperlipidemia   . Hep A w/o coma   . Tinnitus   . Personal history of colonic polyps   . Nephrolithiasis   . Hypertension   . Allergic rhinitis    Past Surgical History  Procedure Date  . Orif wrist- '05   . Correction deviated septum   . Ureteroscopyp     reports that he has never smoked. He has never used smokeless tobacco. He reports that he does not drink alcohol or use illicit drugs. family history includes Diabetes in his father; Heart disease in his mother; Hyperlipidemia in his mother; and Hypertension in his mother. Allergies  Allergen Reactions  . Iodine     REACTION: Hives  . Penicillins     REACTION: Hives   Current Outpatient Prescriptions on File Prior to Visit  Medication Sig Dispense Refill  . cyclobenzaprine (FLEXERIL) 10 MG tablet TAKE 1 TABLET BY MOUTH THREE TIMES DAILY AS NEEDED  60 tablet  2  . diclofenac (VOLTAREN) 75 MG EC tablet Take 1 tablet (75 mg total) by mouth 2 (two) times daily.  60 tablet  6  . lisinopril-hydrochlorothiazide (PRINZIDE,ZESTORETIC) 20-25 MG per tablet Take 1 tablet by mouth daily.  30 tablet  5  . doxycycline (VIBRAMYCIN) 100 MG capsule Take 100 mg by  mouth as needed.         Review of Systems All otherwise neg per pt    Objective:   Physical Exam BP 120/80  Pulse 89  Temp 98.3 F (36.8 C) (Oral)  Ht 5\' 7"  (1.702 m)  Wt 133 lb 8 oz (60.555 kg)  BMI 20.91 kg/m2  SpO2 99% Physical Exam  VS noted Constitutional: Pt appears well-developed and well-nourished.  HENT: Head: Normocephalic.  Right Ear: External ear normal.  Left Ear: External ear normal.  Eyes: Conjunctivae and EOM are normal. Pupils are equal, round, and reactive to light.  Neck: Normal range of motion. Neck supple.  Cardiovascular: Normal rate and regular rhythm.   Pulmonary/Chest: Effort normal and breath sounds normal.  Neurological: Pt is alert. Skin: large area involving entire post neck and occipital area red/scaly/swelling without tender, fluctuance, drainage    Assessment & Plan:

## 2012-05-04 ENCOUNTER — Other Ambulatory Visit: Payer: Self-pay

## 2012-05-04 MED ORDER — CYCLOBENZAPRINE HCL 10 MG PO TABS: 10.0000 mg | ORAL_TABLET | Freq: Three times a day (TID) | ORAL | Status: DC | PRN

## 2012-07-27 ENCOUNTER — Encounter: Payer: Self-pay | Admitting: Internal Medicine

## 2012-07-27 ENCOUNTER — Other Ambulatory Visit (INDEPENDENT_AMBULATORY_CARE_PROVIDER_SITE_OTHER): Payer: BC Managed Care – PPO

## 2012-07-27 ENCOUNTER — Ambulatory Visit (INDEPENDENT_AMBULATORY_CARE_PROVIDER_SITE_OTHER): Payer: BC Managed Care – PPO | Admitting: Internal Medicine

## 2012-07-27 VITALS — BP 114/80 | HR 76 | Temp 97.6°F | Resp 16 | Wt 134.0 lb

## 2012-07-27 DIAGNOSIS — I10 Essential (primary) hypertension: Secondary | ICD-10-CM

## 2012-07-27 DIAGNOSIS — Z23 Encounter for immunization: Secondary | ICD-10-CM | POA: Insufficient documentation

## 2012-07-27 DIAGNOSIS — E782 Mixed hyperlipidemia: Secondary | ICD-10-CM

## 2012-07-27 DIAGNOSIS — Z0000 Encounter for general adult medical examination without abnormal findings: Secondary | ICD-10-CM

## 2012-07-27 LAB — COMPREHENSIVE METABOLIC PANEL
Albumin: 3.8 g/dL (ref 3.5–5.2)
Alkaline Phosphatase: 50 U/L (ref 39–117)
BUN: 13 mg/dL (ref 6–23)
CO2: 31 mEq/L (ref 19–32)
GFR: 74.18 mL/min (ref >60.00)
Glucose, Bld: 102 mg/dL — ABNORMAL HIGH (ref 70–99)
Potassium: 4 mEq/L (ref 3.5–5.1)
Total Bilirubin: 0.6 mg/dL (ref 0.3–1.2)
Total Protein: 6.7 g/dL (ref 6.0–8.3)

## 2012-07-27 LAB — LIPID PANEL
Cholesterol: 227 mg/dL — ABNORMAL HIGH (ref 0–200)
Total CHOL/HDL Ratio: 6
Triglycerides: 214 mg/dL — ABNORMAL HIGH (ref 0.0–149.0)

## 2012-07-27 NOTE — Patient Instructions (Addendum)
Blood pressure - ok to take a drug holiday - hold the blood pressure medication for 1 week and then start checking your blood pressure. You need to be 130 or less on top and less than 90 below.  Cholesterol - last year the LDL was 159 - goal is 130 or less. Plan - lab today with recommendations to follow.  Overall you seem to be doing well and are fit for the election cycle.

## 2012-07-27 NOTE — Progress Notes (Signed)
Subjective:    Patient ID: Jonathan Miranda, male    DOB: 15-Jun-1951, 61 y.o.   MRN: [id number]  HPI Mr. Riesgo presents for an annual medical exam. He has had a good year with no major illness, surgery, or injury.  He did undertake PT of back pain - he did learn several good exercises. He did see Dr. Lattie Haw - no bladder cancer but he does have gallstones. He did take a fall in the spring which required suture repair but he made a good recovery.   He has been to the dentist. He has been to the eye doctor.   Past Medical History  Diagnosis Date  . Prostatitis   . Rash   . Back pain   . Hyperlipidemia   . Hep A w/o coma   . Tinnitus   . Personal history of colonic polyps   . Nephrolithiasis   . Hypertension   . Allergic rhinitis    Past Surgical History  Procedure Date  . Orif wrist- '05   . Correction deviated septum   . Ureteroscopyp    Family History  Problem Relation Age of Onset  . Heart disease Mother   . Hyperlipidemia Mother   . Hypertension Mother   . Diabetes Father    History   Social History  . Marital Status: Single    Spouse Name: N/A    Number of Children: N/A  . Years of Education: N/A   Occupational History  . Not on file.   Social History Main Topics  . Smoking status: Never Smoker   . Smokeless tobacco: Never Used  . Alcohol Use: No  . Drug Use: No  . Sexually Active: Not on file   Other Topics Concern  . 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 cyclobenzaprine (FLEXERIL) 10 MG tablet Take 1 tablet (10 mg total) by mouth 3 (three) times daily as needed for muscle spasms.  60 tablet  2  . diclofenac (VOLTAREN) 75 MG EC tablet Take 1 tablet (75 mg total) by mouth 2 (two) times daily.  60 tablet  6  . hydrOXYzine  (ATARAX/VISTARIL) 25 MG tablet Take 1 tablet (25 mg total) by mouth every 6 (six) hours as needed for itching.  30 tablet  3  . lisinopril-hydrochlorothiazide (PRINZIDE,ZESTORETIC) 20-25 MG per tablet Take 1 tablet by mouth daily.  30 tablet  5  . triamcinolone cream (KENALOG) 0.1 % Apply topically 2 (two) times daily.  30 g  0  . doxycycline (VIBRAMYCIN) 100 MG capsule Take 100 mg by mouth as needed.        . predniSONE (DELTASONE) 10 MG tablet Take 1 tablet (10 mg total) by mouth daily.  30 tablet  0      Review of Systems System review is negative for any constitutional, cardiac, pulmonary, GI or neuro symptoms or complaints other than as described in the HPI.     Objective:   Physical Exam Filed Vitals:   07/27/12 1345  BP: 114/80  Pulse: 76  Temp: 97.6 F (36.4 C)  Resp: 16   Wt Readings from Last 3 Encounters:  07/27/12 134 lb (60.782 kg)  04/27/12 133 lb 8 oz (60.555 kg)  01/07/12 136 lb (61.689 kg)   Gen'l: Well nourished well developed white male  in no acute distress  HEENT: Head: Normocephalic and atraumatic. Right Ear: External ear normal. EAC/TM nl. Left Ear: External ear normal.  EAC/TM nl. Nose: Nose normal. Mouth/Throat: Oropharynx is clear and moist. Dentition - native, in good repair. No buccal or palatal lesions. Posterior pharynx clear. Eyes: Conjunctivae and sclera clear. EOM intact. Pupils are equal, round, and reactive to light. Right eye exhibits no discharge. Left eye exhibits no discharge. Neck: Normal range of motion. Neck supple. No JVD present. No tracheal deviation present. No thyromegaly present.  Cardiovascular: Normal rate, regular rhythm, no gallop, no friction rub, no murmur heard.      Quiet precordium. 2+ radial and DP pulses . No carotid bruits Pulmonary/Chest: Effort normal. No respiratory distress or increased WOB, no wheezes, no rales. No chest wall deformity or CVAT. Abdomen: Soft. Bowel sounds are normal in all quadrants. He exhibits no  distension, no tenderness, no rebound or guarding, No heptosplenomegaly  Genitourinary:  deferred Musculoskeletal: Normal range of motion. He exhibits no edema and no tenderness.       Small and large joints without redness, synovial thickening or deformity. Full range of motion preserved about all small, median and large joints.  Lymphadenopathy:    He has no cervical or supraclavicular adenopathy.  Neurological: He is alert and oriented to person, place, and time. CN II-XII intact. DTRs 2+ and symmetrical biceps, radial and patellar tendons. Cerebellar function normal with no tremor, rigidity, normal gait and station.  Skin: Skin is warm and dry. No rash noted. No erythema.  Psychiatric: He has a normal mood and affect. His behavior is normal. Thought content normal.   Lab Results  Component Value Date   WBC 9.3 07/21/2011   HGB 15.3 07/21/2011   HCT 45.4 07/21/2011   PLT 388.0 07/21/2011   GLUCOSE 102* 07/27/2012   CHOL 227* 07/27/2012   TRIG 214.0* 07/27/2012   HDL 37.30* 07/27/2012   LDLDIRECT 169.1 07/27/2012   LDLCALC 138* 07/18/2009   ALT 23 07/27/2012   AST 23 07/27/2012   NA 124* 07/27/2012   K 4.0 07/27/2012   CL 86* 07/27/2012   CREATININE 1.1 07/27/2012   BUN 13 07/27/2012   CO2 31 07/27/2012   TSH 2.86 07/21/2011   PSA 1.23 07/21/2011        Assessment & Plan:

## 2012-07-31 NOTE — Assessment & Plan Note (Signed)
Interval history is benign. Physical exam in normal. Lab results ok except for cholesterol levels. He is current with colorectal cancer screening. Discussed pros and cons of prostate cancer screening (USPHCTF recommendations reviewed and ACU April '13 recommendations) and he defers evaluation at this time based on normal PSA '08- '12 with last reading of 1.23 - normal and acceleration. Immunizations are up to date except he will need to return for pneumonia vaccine.  In summary - a nice man who appears to be medically stable but will need to address elevated LDL cholesterol.

## 2012-07-31 NOTE — Assessment & Plan Note (Signed)
BP Readings from Last 3 Encounters:  07/27/12 114/80  04/27/12 120/80  01/07/12 132/88   Stable on present medical regimen with normal renal function and electrolytes.  Plan  Continue present treatment regimen

## 2012-07-31 NOTE — Assessment & Plan Note (Signed)
LDL cholesterol (bad) is greater than the treatment threshold of 160+ while the HDL (good) is below goal of 40+  Plan  ROV to discuss treatment options.

## 2012-08-04 ENCOUNTER — Other Ambulatory Visit: Payer: Self-pay | Admitting: *Deleted

## 2012-08-04 MED ORDER — LISINOPRIL-HYDROCHLOROTHIAZIDE 20-25 MG PO TABS: 1.0000 | ORAL_TABLET | Freq: Every day | ORAL | Status: DC

## 2012-08-10 ENCOUNTER — Encounter: Payer: Self-pay | Admitting: Internal Medicine

## 2012-08-10 ENCOUNTER — Ambulatory Visit (INDEPENDENT_AMBULATORY_CARE_PROVIDER_SITE_OTHER): Payer: BC Managed Care – PPO | Admitting: Internal Medicine

## 2012-08-10 VITALS — BP 120/80 | HR 96 | Temp 97.6°F | Resp 16 | Wt 133.0 lb

## 2012-08-10 DIAGNOSIS — E782 Mixed hyperlipidemia: Secondary | ICD-10-CM

## 2012-08-10 MED ORDER — GEMFIBROZIL 600 MG PO TABS: 600.0000 mg | ORAL_TABLET | Freq: Two times a day (BID) | ORAL | Status: DC

## 2012-08-10 NOTE — Patient Instructions (Addendum)
Increased cholesterol: over the past three years:    2011  2012  2013 Total cholesterol  207   222  227 LDL (bad)   144   159  169  Cardiac risk calculation (NCEP ATPIII, [place])  17% risk of a cardiac event over the next 10 years = high moderate risk  Need to do something: Start gemfibrozil 600 mg  Titration plan:  1/2 tab once a day x 3; 1/2 tab AM, PM x 3 days; 1 tab AM 1/2 tab PM x 3 days then 1 tab twice a day  Follow -up: repeat lipid profile in 8 weeks.

## 2012-08-10 NOTE — Assessment & Plan Note (Addendum)
Plan - start gemfibrozil  (greater than 50% of 20 min visit spent on education and counseling)

## 2012-08-13 NOTE — Progress Notes (Signed)
  Subjective:    Patient ID: Jonathan Miranda, male    DOB: 1951-06-18, 61 y.o.   MRN: [id number]  HPI Jonathan Miranda returns to discuss abnormal lipids and need for treatment.  PMH, FamHx and SocHx reviewed for any changes and relevance.  Medications - no change since physical exam   Review of Systems System review is negative for any constitutional, cardiac, pulmonary, GI or neuro symptoms or complaints other than as described in the HPI.     Objective:   Physical Exam Filed Vitals:   08/10/12 1507  BP: 120/80  Pulse: 96  Temp: 97.6 F (36.4 C)  Resp: 16          Assessment & Plan:

## 2012-08-15 ENCOUNTER — Other Ambulatory Visit: Payer: Self-pay | Admitting: *Deleted

## 2012-08-15 MED ORDER — CYCLOBENZAPRINE HCL 10 MG PO TABS: 10.0000 mg | ORAL_TABLET | Freq: Three times a day (TID) | ORAL | Status: DC | PRN

## 2012-08-15 NOTE — Telephone Encounter (Signed)
Medication refill on cyclobenzaprine sent to [facility].

## 2012-09-20 ENCOUNTER — Ambulatory Visit (INDEPENDENT_AMBULATORY_CARE_PROVIDER_SITE_OTHER): Payer: BC Managed Care – PPO | Admitting: Internal Medicine

## 2012-09-20 ENCOUNTER — Encounter: Payer: Self-pay | Admitting: Internal Medicine

## 2012-09-20 VITALS — BP 120/82 | HR 80 | Temp 97.3°F | Resp 16 | Wt 135.0 lb

## 2012-09-20 DIAGNOSIS — H6120 Impacted cerumen, unspecified ear: Secondary | ICD-10-CM

## 2012-09-20 DIAGNOSIS — H6122 Impacted cerumen, left ear: Secondary | ICD-10-CM

## 2012-09-25 ENCOUNTER — Encounter: Payer: Self-pay | Admitting: Internal Medicine

## 2012-09-25 DIAGNOSIS — H6122 Impacted cerumen, left ear: Secondary | ICD-10-CM | POA: Insufficient documentation

## 2012-09-25 NOTE — Patient Instructions (Signed)
Cerumen Impaction  A cerumen impaction is when the wax in your ear forms a plug. This plug usually causes reduced hearing. Sometimes it also causes an earache or dizziness. Removing a cerumen impaction can be difficult and painful. The wax sticks to the ear canal. The canal is sensitive and bleeds easily. If you try to remove a heavy wax buildup with a cotton tipped swab, you may push it in further.  Irrigation with water, suction, and small ear curettes may be used to clear out the wax. If the impaction is fixed to the skin in the ear canal, ear drops may be needed for a few days to loosen the wax. People who build up a lot of wax frequently can use ear wax removal products available in your local drugstore.  SEEK MEDICAL CARE IF:    You develop an earache, increased hearing loss, or marked dizziness.  Document Released: 10/29/2004 Document Revised: 12/14/2011 Document Reviewed: 12/19/2009  [employer] Patient Information 2013 [employer].

## 2012-09-25 NOTE — Assessment & Plan Note (Signed)
Successful irrigation today He will use murine ear wax removal kit to prevent this from happening in the future

## 2012-09-25 NOTE — Progress Notes (Signed)
Subjective:    Patient ID: Jonathan Miranda, male    DOB: 07/10/1951, 61 y.o.   MRN: [id number]  Otalgia  There is pain in the left ear. This is a new problem. The current episode started 1 to 4 weeks ago. The problem occurs every few hours. The problem has been unchanged. There has been no fever. The fever has been present for less than 1 day. The pain is at a severity of 0/10. The patient is experiencing no pain. Associated symptoms include hearing loss. Pertinent negatives include no abdominal pain, coughing, diarrhea, drainage, ear discharge, headaches, neck pain, rash, rhinorrhea, sore throat or vomiting. He has tried ear drops for the symptoms. The treatment provided no relief.      Review of Systems  Constitutional: Negative.   HENT: Positive for hearing loss, ear pain and tinnitus. Negative for nosebleeds, congestion, sore throat, rhinorrhea, sneezing, mouth sores, trouble swallowing, neck pain, dental problem, voice change, postnasal drip and ear discharge.   Eyes: Negative.   Respiratory: Negative for cough.   Cardiovascular: Negative.   Gastrointestinal: Negative.  Negative for vomiting, abdominal pain and diarrhea.  Genitourinary: Negative.   Musculoskeletal: Negative.   Skin: Negative.  Negative for rash.  Neurological: Negative.  Negative for headaches.  Hematological: Negative.   Psychiatric/Behavioral: Negative.        Objective:   Physical Exam  Vitals reviewed. Constitutional: He is oriented to person, place, and time. He appears well-developed and well-nourished. No distress.  HENT:  Head: Normocephalic and atraumatic.  Right Ear: Hearing, external ear and ear canal normal.  Left Ear: Hearing, tympanic membrane and external ear normal. No lacerations. No drainage, swelling or tenderness. A foreign body (cerumen in the left ear occludes the canal) is present. No mastoid tenderness. Tympanic membrane is not injected, not scarred, not perforated, not erythematous,  not retracted and not bulging. Tympanic membrane mobility is normal.  No middle ear effusion. No hemotympanum. No decreased hearing is noted.  Mouth/Throat: Oropharynx is clear and moist and mucous membranes are normal. Mucous membranes are not pale, not dry and not cyanotic. No oropharyngeal exudate, posterior oropharyngeal edema, posterior oropharyngeal erythema or tonsillar abscesses.       I put colace in the left ear then irrigated it and the wax was removed, he tolerated it well, the exam is normal after that and he feels well  Eyes: Conjunctivae normal are normal. Right eye exhibits no discharge. Left eye exhibits no discharge. No scleral icterus.  Neck: Normal range of motion. Neck supple. No JVD present. No tracheal deviation present. No thyromegaly present.  Cardiovascular: Normal rate, regular rhythm and intact distal pulses.  Exam reveals no gallop and no friction rub.   No murmur heard. Pulmonary/Chest: Effort normal and breath sounds normal. No stridor. No respiratory distress. He has no wheezes. He has no rales. He exhibits no tenderness.  Abdominal: Soft. Bowel sounds are normal. He exhibits no distension and no mass. There is no tenderness. There is no rebound and no guarding.  Musculoskeletal: Normal range of motion. He exhibits no edema and no tenderness.  Lymphadenopathy:    He has no cervical adenopathy.  Neurological: He is oriented to person, place, and time.  Skin: Skin is warm and dry. No rash noted. He is not diaphoretic. No erythema. No pallor.  Psychiatric: He has a normal mood and affect. His behavior is normal. Judgment and thought content normal.          Assessment & Plan:

## 2012-10-25 ENCOUNTER — Other Ambulatory Visit (INDEPENDENT_AMBULATORY_CARE_PROVIDER_SITE_OTHER): Payer: BC Managed Care – PPO

## 2012-10-25 DIAGNOSIS — E782 Mixed hyperlipidemia: Secondary | ICD-10-CM

## 2012-10-25 LAB — LIPID PANEL
HDL: 47.2 mg/dL (ref >39.00)
Total CHOL/HDL Ratio: 4
Triglycerides: 72 mg/dL (ref 0.0–149.0)
VLDL: 14.4 mg/dL (ref 0.0–40.0)

## 2012-10-25 LAB — LDL CHOLESTEROL, DIRECT: Direct LDL: 142.1 mg/dL

## 2013-02-09 ENCOUNTER — Other Ambulatory Visit: Payer: Self-pay

## 2013-02-09 MED ORDER — LISINOPRIL-HYDROCHLOROTHIAZIDE 20-25 MG PO TABS: 1.0000 | ORAL_TABLET | Freq: Every day | ORAL | Status: DC

## 2013-04-20 ENCOUNTER — Other Ambulatory Visit: Payer: Self-pay | Admitting: Internal Medicine

## 2013-08-10 ENCOUNTER — Other Ambulatory Visit: Payer: Self-pay

## 2013-08-17 ENCOUNTER — Other Ambulatory Visit: Payer: Self-pay | Admitting: Internal Medicine

## 2013-08-23 ENCOUNTER — Ambulatory Visit (INDEPENDENT_AMBULATORY_CARE_PROVIDER_SITE_OTHER): Payer: BC Managed Care – PPO | Admitting: Internal Medicine

## 2013-08-23 ENCOUNTER — Other Ambulatory Visit (INDEPENDENT_AMBULATORY_CARE_PROVIDER_SITE_OTHER): Payer: BC Managed Care – PPO

## 2013-08-23 ENCOUNTER — Encounter: Payer: Self-pay | Admitting: Internal Medicine

## 2013-08-23 VITALS — BP 118/80 | HR 80 | Temp 97.6°F | Ht 67.0 in | Wt 141.0 lb

## 2013-08-23 DIAGNOSIS — I10 Essential (primary) hypertension: Secondary | ICD-10-CM

## 2013-08-23 DIAGNOSIS — Z23 Encounter for immunization: Secondary | ICD-10-CM

## 2013-08-23 DIAGNOSIS — E782 Mixed hyperlipidemia: Secondary | ICD-10-CM

## 2013-08-23 DIAGNOSIS — Z0000 Encounter for general adult medical examination without abnormal findings: Secondary | ICD-10-CM

## 2013-08-23 DIAGNOSIS — H9319 Tinnitus, unspecified ear: Secondary | ICD-10-CM

## 2013-08-23 DIAGNOSIS — Z86010 Personal history of colonic polyps: Secondary | ICD-10-CM

## 2013-08-23 LAB — LIPID PANEL
Cholesterol: 284 mg/dL — ABNORMAL HIGH (ref 0–200)
Total CHOL/HDL Ratio: 7

## 2013-08-23 LAB — COMPREHENSIVE METABOLIC PANEL
AST: 26 U/L (ref 0–37)
Albumin: 4.3 g/dL (ref 3.5–5.2)
Alkaline Phosphatase: 59 U/L (ref 39–117)
Glucose, Bld: 86 mg/dL (ref 70–99)
Potassium: 4.1 mEq/L (ref 3.5–5.1)
Sodium: 129 mEq/L — ABNORMAL LOW (ref 135–145)
Total Protein: 6.9 g/dL (ref 6.0–8.3)

## 2013-08-23 LAB — HEPATIC FUNCTION PANEL
ALT: 28 U/L (ref 0–53)
Total Bilirubin: 0.7 mg/dL (ref 0.3–1.2)

## 2013-08-23 NOTE — Progress Notes (Signed)
Subjective:    Patient ID: Jonathan Miranda, male    DOB: [birth year], 62 y.o.   MRN: [id number]  HPI Jonathan Miranda for a general wellness exam.   Cholesterol management: He had an allergy to gemfibrozil-hives. He has been intolerant of statins. Cardiac risk -14% w/o medication.  He has been doing well: arthritis has been quiet; continues to walk regularly.  Past Medical History  Diagnosis Date  . Prostatitis   . Rash   . Back pain   . Hyperlipidemia   . Hep A w/o coma   . Tinnitus   . Personal history of colonic polyps   . Nephrolithiasis   . Hypertension   . Allergic rhinitis    Past Surgical History  Procedure Laterality Date  . Orif wrist- '05    . Correction deviated septum    . Ureteroscopyp     Family History  Problem Relation Age of Onset  . Heart disease Mother   . Hyperlipidemia Mother   . Hypertension Mother   . Diabetes Father    History   Social History  . Marital Status: Single    Spouse Name: N/A    Number of Children: N/A  . Years of Education: N/A   Occupational History  . Not on file.   Social History Main Topics  . Smoking status: Never Smoker   . Smokeless tobacco: Never Used  . Alcohol Use: Yes  . Drug Use: No  . Sexual Activity: Yes   Other Topics Concern  . 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 cyclobenzaprine (FLEXERIL) 10 MG tablet Take 1 tablet (10 mg total) by mouth 3 (three) times daily as needed for muscle spasms.  90 tablet  3  . diclofenac (VOLTAREN) 75 MG EC tablet TAKE 1 TABLET BY MOUTH TWICE DAILY  60 tablet  5  . hydrOXYzine (ATARAX/VISTARIL) 25 MG tablet Take 1 tablet (25 mg total) by mouth every 6 (six) hours as needed for itching.  30 tablet  3  .  lisinopril-hydrochlorothiazide (PRINZIDE,ZESTORETIC) 20-25 MG per tablet TAKE 1 TABLET BY MOUTH DAILY  30 tablet  2   No current facility-administered medications on file prior to visit.      Review of Systems Constitutional:  Negative for fever, chills, activity change and unexpected weight change.  HEENT:  Negative for hearing loss, ear pain, congestion, neck stiffness and postnasal drip. Negative for sore throat or swallowing problems. Negative for dental complaints.   Eyes: Negative for vision loss or change in visual acuity.  Respiratory: Negative for chest tightness and wheezing. Negative for DOE.   Cardiovascular: Negative for chest pain or palpitations. No decreased exercise tolerance Gastrointestinal: No change in bowel habit. No bloating or gas. No reflux or indigestion Genitourinary: Negative for urgency, frequency, flank pain and difficulty urinating.  Musculoskeletal: Negative for myalgias, back pain, arthralgias and gait problem.  Neurological: Negative for dizziness, weakness and headaches. Chronic minor tremor. Hematological: Negative for adenopathy.  Psychiatric/Behavioral: Negative for behavioral problems and dysphoric mood.       Objective:   Physical Exam Filed Vitals:   08/23/13 1008  BP: 118/80  Pulse: 80  Temp: 97.6 F (36.4 C)   Wt Readings from Last 3  Encounters:  08/23/13 141 lb (63.957 kg)  09/20/12 135 lb (61.236 kg)  08/10/12 133 lb (60.328 kg)   Gen'l: Well nourished well developed  male in no acute distress  HEENT: Head: Normocephalic and atraumatic. Right Ear: External ear normal. EAC/TM nl. Left Ear: External ear normal.  EAC/TM nl. Nose: Nose normal. Mouth/Throat: Oropharynx is clear and moist. Dentition - native, in good repair. No buccal or palatal lesions. Posterior pharynx clear. Eyes: Conjunctivae and sclera clear. EOM intact. Pupils are equal, round, and reactive to light. Right eye exhibits no discharge. Left eye exhibits no  discharge. Neck: Normal range of motion. Neck supple. No JVD present. No tracheal deviation present. No thyromegaly present.  Cardiovascular: Normal rate, regular rhythm, no gallop, no friction rub, no murmur heard.      Quiet precordium. 2+ radial and DP pulses . No carotid bruits Pulmonary/Chest: Effort normal. No respiratory distress or increased WOB, no wheezes, no rales. No chest wall deformity or CVAT. Abdomen: Soft. Bowel sounds are normal in all quadrants. He exhibits no distension, no tenderness, no rebound or guarding, No heptosplenomegaly  Genitourinary:  deferred Musculoskeletal: Normal range of motion. He exhibits no edema and no tenderness.       Small and large joints without redness, synovial thickening or deformity. Full range of motion preserved about all small, median and large joints.  Lymphadenopathy:    He has no cervical or supraclavicular adenopathy.  Neurological: He is alert and oriented to person, place, and time. CN II-XII intact. DTRs 2+ and symmetrical biceps, radial and patellar tendons. Cerebellar function normal with no tremor, rigidity, normal gait and station.  Skin: Skin is warm and dry. No rash noted. No erythema.  Psychiatric: He has a normal mood and affect. His behavior is normal. Thought content normal.   Recent Results (from the past 2160 hour(s))  HEMOGLOBIN A1C     Status: Abnormal   Collection Time    08/23/13 10:56 AM      Result Value Range   Hemoglobin A1C 6.7 (*) 4.6 - 6.5 %   Comment: Glycemic Control Guidelines for People with Diabetes:Non Diabetic:  <6%Goal of Therapy: <7%Additional Action Suggested:  >8%   HEPATIC FUNCTION PANEL     Status: None   Collection Time    08/23/13 10:56 AM      Result Value Range   Total Bilirubin 0.7  0.3 - 1.2 mg/dL   Bilirubin, Direct 0.0  0.0 - 0.3 mg/dL   Alkaline Phosphatase 59  39 - 117 U/L   AST 26  0 - 37 U/L   ALT 28  0 - 53 U/L   Total Protein 6.9  6.0 - 8.3 g/dL   Albumin 4.3  3.5 - 5.2 g/dL   COMPREHENSIVE METABOLIC PANEL     Status: Abnormal   Collection Time    08/23/13 10:56 AM      Result Value Range   Sodium 129 (*) 135 - 145 mEq/L   Potassium 4.1  3.5 - 5.1 mEq/L   Chloride 91 (*) 96 - 112 mEq/L   CO2 32  19 - 32 mEq/L   Glucose, Bld 86  70 - 99 mg/dL   BUN 15  6 - 23 mg/dL   Creatinine, Ser 1.2  0.4 - 1.5 mg/dL   Total Bilirubin 0.7  0.3 - 1.2 mg/dL   Alkaline Phosphatase 59  39 - 117 U/L   AST 26  0 - 37 U/L   ALT 28  0 -  53 U/L   Total Protein 6.9  6.0 - 8.3 g/dL   Albumin 4.3  3.5 - 5.2 g/dL   Calcium 9.8  8.4 - 16.1 mg/dL   GFR 09.60  >45.40 mL/min  LIPID PANEL     Status: Abnormal   Collection Time    08/23/13 10:56 AM      Result Value Range   Cholesterol 284 (*) 0 - 200 mg/dL   Comment: ATP III Classification       Desirable:  < 200 mg/dL               Borderline High:  200 - 239 mg/dL          High:  > = 981 mg/dL   Triglycerides   (*) 0.0 - 149.0 mg/dL   Value: 191.4 Triglyceride is over 400; calculations on Lipids are invalid.   Comment: Normal:  <150 mg/dLBorderline High:  150 - 199 mg/dL   HDL 78.29 (*) >56.21 mg/dL   VLDL 30.8 (*) 0.0 - 65.7 mg/dL   Total CHOL/HDL Ratio 7     Comment:                Men          Women1/2 Average Risk     3.4          3.3Average Risk          5.0          4.42X Average Risk          9.6          7.13X Average Risk          15.0          11.0                      LDL CHOLESTEROL, DIRECT     Status: None   Collection Time    08/23/13 10:56 AM      Result Value Range   Direct LDL 173.3     Comment: Optimal:  <100 mg/dLNear or Above Optimal:  100-129 mg/dLBorderline High:  130-159 mg/dLHigh:  160-189 mg/dLVery High:  >190 mg/dL         Assessment & Plan:

## 2013-08-23 NOTE — Progress Notes (Signed)
Pre visit review using our clinic review tool, if applicable. No additional management support is needed unless otherwise documented below in the visit note. 

## 2013-08-23 NOTE — Patient Instructions (Signed)
Good to see you. Enjoy the off season  Your exam is good. Labs today will tell the tale and the results will be posted to MyChart.  Immunizations: return in 3 weeks for a nurse visit to get Prevnar pneumonia vaccine; return 10 weeks after that for the companion vaccine Pneumovax to give you complete pneumonia immunization.  Cholesterol - your cardiac risk - 14% chance of a cardiac event in the next 10 years - low moderate risk. Working on the cholesterol is worthwhile. Plan Will refer you to the Lipid Clinic at [facility]  Return for a routine exam in a year.

## 2013-08-24 NOTE — Assessment & Plan Note (Signed)
Interval history is benign. Physical exam is normal. Lab results reviewed - continue hyperlipidemia otherwise normal. He is due for follow up colonoscopy. Discussed pros and cons of prostate cancer screening (USPHCTF recommendations reviewed and ACU April '13 recommendations) and he defers evaluation at this time. Immunizations - will be brought up to date.  In summary A nice man who appears medically stable and well. He will return for immunizations and will contact [facility] gastroenterology re: f/u colonoscopy.

## 2013-08-24 NOTE — Assessment & Plan Note (Signed)
Patient with elevated LDL, intolerance of "statins" and lopid. Framingham risk calculation = 14%, low moderate.  Plan Refer to lipid clinic

## 2013-08-24 NOTE — Assessment & Plan Note (Signed)
BP Readings from Last 3 Encounters:  08/23/13 118/80  09/20/12 120/82  08/10/12 120/80   Good control on present medications. Bmet normal.  Plan Continue present regimen

## 2013-08-24 NOTE — Assessment & Plan Note (Signed)
Patient is probably due for follow up colonoscopy.  Plan - he will contact [facility] to schedule.

## 2013-11-17 ENCOUNTER — Other Ambulatory Visit: Payer: Self-pay | Admitting: Internal Medicine

## 2014-03-27 ENCOUNTER — Other Ambulatory Visit: Payer: Self-pay

## 2014-03-27 MED ORDER — CYCLOBENZAPRINE HCL 10 MG PO TABS: 10.0000 mg | ORAL_TABLET | Freq: Three times a day (TID) | ORAL | Status: DC | PRN

## 2014-05-22 ENCOUNTER — Other Ambulatory Visit: Payer: Self-pay

## 2014-05-22 MED ORDER — LISINOPRIL-HYDROCHLOROTHIAZIDE 20-25 MG PO TABS: 1.0000 | ORAL_TABLET | Freq: Every day | ORAL | Status: DC

## 2014-05-29 ENCOUNTER — Other Ambulatory Visit: Payer: Self-pay

## 2014-05-29 MED ORDER — DICLOFENAC SODIUM 75 MG PO TBEC: 75.0000 mg | DELAYED_RELEASE_TABLET | Freq: Two times a day (BID) | ORAL | Status: DC

## 2014-07-19 ENCOUNTER — Ambulatory Visit (INDEPENDENT_AMBULATORY_CARE_PROVIDER_SITE_OTHER): Payer: Medicare Other | Admitting: Internal Medicine

## 2014-07-19 ENCOUNTER — Encounter: Payer: Self-pay | Admitting: Internal Medicine

## 2014-07-19 ENCOUNTER — Other Ambulatory Visit (INDEPENDENT_AMBULATORY_CARE_PROVIDER_SITE_OTHER): Payer: Medicare Other

## 2014-07-19 VITALS — BP 110/80 | HR 83 | Temp 98.3°F | Wt 141.1 lb

## 2014-07-19 DIAGNOSIS — Z23 Encounter for immunization: Secondary | ICD-10-CM | POA: Diagnosis not present

## 2014-07-19 DIAGNOSIS — N320 Bladder-neck obstruction: Secondary | ICD-10-CM | POA: Diagnosis not present

## 2014-07-19 DIAGNOSIS — E119 Type 2 diabetes mellitus without complications: Secondary | ICD-10-CM | POA: Diagnosis not present

## 2014-07-19 DIAGNOSIS — E782 Mixed hyperlipidemia: Secondary | ICD-10-CM

## 2014-07-19 DIAGNOSIS — I10 Essential (primary) hypertension: Secondary | ICD-10-CM | POA: Diagnosis not present

## 2014-07-19 LAB — CBC WITH DIFFERENTIAL/PLATELET
BASOS ABS: 0.1 10*3/uL (ref 0.0–0.1)
Basophils Relative: 0.7 % (ref 0.0–3.0)
EOS PCT: 1.1 % (ref 0.0–5.0)
Eosinophils Absolute: 0.1 10*3/uL (ref 0.0–0.7)
HEMATOCRIT: 47.2 % (ref 39.0–52.0)
Hemoglobin: 16 g/dL (ref 13.0–17.0)
LYMPHS ABS: 2.6 10*3/uL (ref 0.7–4.0)
Lymphocytes Relative: 31.4 % (ref 12.0–46.0)
MCHC: 33.8 g/dL (ref 30.0–36.0)
MCV: 85.7 fl (ref 78.0–100.0)
Monocytes Absolute: 0.9 10*3/uL (ref 0.1–1.0)
Monocytes Relative: 10.9 % (ref 3.0–12.0)
Neutro Abs: 4.7 10*3/uL (ref 1.4–7.7)
Neutrophils Relative %: 55.9 % (ref 43.0–77.0)
PLATELETS: 433 10*3/uL — AB (ref 150.0–400.0)
RBC: 5.51 Mil/uL (ref 4.22–5.81)
RDW: 14.4 % (ref 11.5–15.5)
WBC: 8.4 10*3/uL (ref 4.0–10.5)

## 2014-07-19 LAB — URINALYSIS, ROUTINE W REFLEX MICROSCOPIC
BILIRUBIN URINE: NEGATIVE
Hgb urine dipstick: NEGATIVE
KETONES UR: NEGATIVE
LEUKOCYTES UA: NEGATIVE
Nitrite: NEGATIVE
PH: 7.5 (ref 5.0–8.0)
Specific Gravity, Urine: 1.01 (ref 1.000–1.030)
Total Protein, Urine: NEGATIVE
Urine Glucose: NEGATIVE
Urobilinogen, UA: 0.2 (ref 0.0–1.0)

## 2014-07-19 LAB — MICROALBUMIN / CREATININE URINE RATIO
Creatinine,U: 40.1 mg/dL
Microalb Creat Ratio: 3.2 mg/g (ref 0.0–30.0)
Microalb, Ur: 1.3 mg/dL (ref 0.0–1.9)

## 2014-07-19 LAB — HEPATIC FUNCTION PANEL
ALK PHOS: 59 U/L (ref 39–117)
ALT: 33 U/L (ref 0–53)
AST: 30 U/L (ref 0–37)
Albumin: 4 g/dL (ref 3.5–5.2)
BILIRUBIN DIRECT: 0.2 mg/dL (ref 0.0–0.3)
BILIRUBIN TOTAL: 1.3 mg/dL — AB (ref 0.2–1.2)
Total Protein: 7.2 g/dL (ref 6.0–8.3)

## 2014-07-19 LAB — BASIC METABOLIC PANEL
BUN: 12 mg/dL (ref 6–23)
CO2: 31 meq/L (ref 19–32)
CREATININE: 1.1 mg/dL (ref 0.4–1.5)
Calcium: 10.1 mg/dL (ref 8.4–10.5)
Chloride: 87 mEq/L — ABNORMAL LOW (ref 96–112)
GFR: 72.92 mL/min (ref >60.00)
GLUCOSE: 123 mg/dL — AB (ref 70–99)
Potassium: 4.2 mEq/L (ref 3.5–5.1)
Sodium: 124 mEq/L — ABNORMAL LOW (ref 135–145)

## 2014-07-19 LAB — LDL CHOLESTEROL, DIRECT: Direct LDL: 164.3 mg/dL

## 2014-07-19 LAB — HEMOGLOBIN A1C: HEMOGLOBIN A1C: 7.2 % — AB (ref 4.6–6.5)

## 2014-07-19 LAB — LIPID PANEL
CHOLESTEROL: 251 mg/dL — AB (ref 0–200)
HDL: 30.3 mg/dL — AB (ref >39.00)
NONHDL: 220.7
TRIGLYCERIDES: 260 mg/dL — AB (ref 0.0–149.0)
Total CHOL/HDL Ratio: 8
VLDL: 52 mg/dL — ABNORMAL HIGH (ref 0.0–40.0)

## 2014-07-19 LAB — TSH: TSH: 1.65 u[IU]/mL (ref 0.35–4.50)

## 2014-07-19 LAB — PSA: PSA: 1.03 ng/mL (ref 0.10–4.00)

## 2014-07-19 NOTE — Assessment & Plan Note (Signed)
stable overall by history and exam, recent data reviewed with pt, and pt to continue medical treatment as before,  to f/u any worsening symptoms or concerns Lab Results  Component Value Date   HGBA1C 6.7* 08/23/2013   For fu labs

## 2014-07-19 NOTE — Progress Notes (Signed)
Subjective:    Patient ID: Jonathan Miranda, male    DOB: [birth year], 63 y.o.   MRN: [id number]  HPI  Here for yearly f/u;  Overall doing ok;  Pt denies CP, worsening SOB, DOE, wheezing, orthopnea, PND, worsening LE edema, palpitations, dizziness or syncope.  Pt denies neurological change such as new headache, facial or extremity weakness.  Pt denies polydipsia, polyuria, or low sugar symptoms. Pt states overall good compliance with treatment and medications, good tolerability, and has been trying to follow lower cholesterol diet.  Pt denies worsening depressive symptoms, suicidal ideation or panic. No fever, night sweats, wt loss, loss of appetite, or other constitutional symptoms.  Pt states good ability with ADL's, has low fall risk, home safety reviewed and adequate, no other significant changes in hearing or vision, and trying to stay quite active with exercise, goes to 2 gyms on occasion. Past Medical History  Diagnosis Date  . Prostatitis   . Rash   . Back pain   . Hyperlipidemia   . Hep A w/o coma   . Tinnitus   . Personal history of colonic polyps   . Nephrolithiasis   . Hypertension   . Allergic rhinitis    Past Surgical History  Procedure Laterality Date  . Orif wrist- '05    . Correction deviated septum    . Ureteroscopyp      reports that he has never smoked. He has never used smokeless tobacco. He reports that he drinks alcohol. He reports that he does not use illicit drugs. family history includes Diabetes in his father; Heart disease in his mother; Hyperlipidemia in his mother; Hypertension in his mother. Allergies  Allergen Reactions  . Iodine     REACTION: Hives  . Penicillins     REACTION: Hives   Current Outpatient Prescriptions on File Prior to Visit  Medication Sig Dispense Refill  . cyclobenzaprine (FLEXERIL) 10 MG tablet Take 1 tablet (10 mg total) by mouth 3 (three) times daily as needed for muscle spasms.  90 tablet  1  . diclofenac (VOLTAREN) 75 MG  EC tablet Take 1 tablet (75 mg total) by mouth 2 (two) times daily.  60 tablet  1  . hydrOXYzine (ATARAX/VISTARIL) 25 MG tablet Take 1 tablet (25 mg total) by mouth every 6 (six) hours as needed for itching.  30 tablet  3  . lisinopril-hydrochlorothiazide (PRINZIDE,ZESTORETIC) 20-25 MG per tablet Take 1 tablet by mouth daily.  30 tablet  1   No current facility-administered medications on file prior to visit.   Review of Systems Constitutional: Negative for increased diaphoresis, other activity, appetite or other siginficant weight change  HENT: Negative for worsening hearing loss, ear pain, facial swelling, mouth sores and neck stiffness.   Eyes: Negative for other worsening pain, redness or visual disturbance.  Respiratory: Negative for shortness of breath and wheezing.   Cardiovascular: Negative for chest pain and palpitations.  Gastrointestinal: Negative for diarrhea, blood in stool, abdominal distention or other pain Genitourinary: Negative for hematuria, flank pain or change in urine volume.  Musculoskeletal: Negative for myalgias or other joint complaints.  Skin: Negative for color change and wound.  Neurological: Negative for syncope and numbness. other than noted Hematological: Negative for adenopathy. or other swelling Psychiatric/Behavioral: Negative for hallucinations, self-injury, decreased concentration or other worsening agitation.      Objective:   Physical Exam BP 110/80  Pulse 83  Temp(Src) 98.3 F (36.8 C) (Oral)  Wt 141 lb 2 oz (64.014 kg)  SpO2 98% VS noted,  Constitutional: Pt is oriented to person, place, and time. Appears well-developed and well-nourished.  Head: Normocephalic and atraumatic.  Right Ear: External ear normal.  Left Ear: External ear normal.  Nose: Nose normal.  Mouth/Throat: Oropharynx is clear and moist.  Eyes: Conjunctivae and EOM are normal. Pupils are equal, round, and reactive to light.  Neck: Normal range of motion. Neck supple. No  JVD present. No tracheal deviation present.  Cardiovascular: Normal rate, regular rhythm, normal heart sounds and intact distal pulses.   Pulmonary/Chest: Effort normal and breath sounds without rales or wheezing  Abdominal: Soft. Bowel sounds are normal. NT. No HSM  Musculoskeletal: Normal range of motion. Exhibits no edema.  Lymphadenopathy:  Has no cervical adenopathy.  Neurological: Pt is alert and oriented to person, place, and time. Pt has normal reflexes. No cranial nerve deficit. Motor grossly intact Skin: Skin is warm and dry. No rash noted.  Psychiatric:  Has mild nervous mood and affect. Behavior is normal.     Assessment & Plan:

## 2014-07-19 NOTE — Assessment & Plan Note (Signed)
stable overall by history and exam, recent data reviewed with pt, and pt to continue medical treatment as before,  to f/u any worsening symptoms or concerns BP Readings from Last 3 Encounters:  07/19/14 110/80  08/23/13 118/80  09/20/12 120/82

## 2014-07-19 NOTE — Assessment & Plan Note (Signed)
stable overall by history and exam, recent data reviewed with pt, and pt to continue medical treatment as before,  to f/u any worsening symptoms or concerns Lab Results  Component Value Date   LDLCALC 138* 07/18/2009

## 2014-07-19 NOTE — Progress Notes (Signed)
Pre visit review using our clinic review tool, if applicable. No additional management support is needed unless otherwise documented below in the visit note. 

## 2014-07-19 NOTE — Patient Instructions (Addendum)
You had the flu shot today  Please return in 2 weeks for a Nurse Visit appt for the new Prevnar pneumonia shot  We can plan on the Pneumovax for next yr  Please continue all other medications as before, and refills have been done if requested.  Please have the pharmacy call with any other refills you may need.  Please continue your efforts at being more active, low cholesterol diet, and weight control.  You are otherwise up to date with prevention measures today.  Please keep your appointments with your specialists as you may have planned  Please go to the LAB in the Basement (turn left off the elevator) for the tests to be done today  You will be contacted by phone if any changes need to be made immediately.  Otherwise, you will receive a letter about your results with an explanation, but please check with MyChart first.  Please return in 6 months, or sooner if needed

## 2014-07-26 ENCOUNTER — Encounter: Payer: Self-pay | Admitting: Internal Medicine

## 2014-07-26 ENCOUNTER — Other Ambulatory Visit (INDEPENDENT_AMBULATORY_CARE_PROVIDER_SITE_OTHER): Payer: Medicare Other

## 2014-07-26 ENCOUNTER — Ambulatory Visit (INDEPENDENT_AMBULATORY_CARE_PROVIDER_SITE_OTHER): Payer: Medicare Other | Admitting: Internal Medicine

## 2014-07-26 VITALS — BP 140/90 | HR 86 | Temp 98.2°F | Wt 139.5 lb

## 2014-07-26 DIAGNOSIS — E119 Type 2 diabetes mellitus without complications: Secondary | ICD-10-CM | POA: Diagnosis not present

## 2014-07-26 DIAGNOSIS — E782 Mixed hyperlipidemia: Secondary | ICD-10-CM

## 2014-07-26 DIAGNOSIS — I10 Essential (primary) hypertension: Secondary | ICD-10-CM

## 2014-07-26 DIAGNOSIS — E871 Hypo-osmolality and hyponatremia: Secondary | ICD-10-CM | POA: Insufficient documentation

## 2014-07-26 LAB — BASIC METABOLIC PANEL
BUN: 12 mg/dL (ref 6–23)
CALCIUM: 9.1 mg/dL (ref 8.4–10.5)
CO2: 32 mEq/L (ref 19–32)
Chloride: 94 mEq/L — ABNORMAL LOW (ref 96–112)
Creatinine, Ser: 1 mg/dL (ref 0.4–1.5)
GFR: 82.54 mL/min (ref >60.00)
GLUCOSE: 130 mg/dL — AB (ref 70–99)
Potassium: 3.5 mEq/L (ref 3.5–5.1)
Sodium: 131 mEq/L — ABNORMAL LOW (ref 135–145)

## 2014-07-26 MED ORDER — LISINOPRIL 20 MG PO TABS: 20.0000 mg | ORAL_TABLET | Freq: Every day | ORAL | Status: DC

## 2014-07-26 NOTE — Progress Notes (Signed)
Subjective:    Patient ID: Jonathan Miranda, male    DOB: 1951-02-16, 64 y.o.   MRN: [id number]  HPI  Here to f/u; overall doing ok,  Pt denies chest pain, increased sob or doe, wheezing, orthopnea, PND, increased LE swelling, palpitations, dizziness or syncope.  Pt denies polydipsia, polyuria, or low sugar symptoms such as weakness or confusion improved with po intake.  Pt denies new neurological symptoms such as new headache, or facial or extremity weakness or numbness.   Pt states overall good compliance with meds, has been trying to follow lower cholesterol diet, with wt overall stable,  but little exercise however, and admits to increased sweet tea in last few months. Past Medical History  Diagnosis Date  . Prostatitis   . Rash   . Back pain   . Hyperlipidemia   . Hep A w/o coma   . Tinnitus   . Personal history of colonic polyps   . Nephrolithiasis   . Hypertension   . Allergic rhinitis    Past Surgical History  Procedure Laterality Date  . Orif wrist- '05    . Correction deviated septum    . Ureteroscopyp      reports that he has never smoked. He has never used smokeless tobacco. He reports that he drinks alcohol. He reports that he does not use illicit drugs. family history includes Diabetes in his father; Heart disease in his mother; Hyperlipidemia in his mother; Hypertension in his mother. Allergies  Allergen Reactions  . Iodine     REACTION: Hives  . Penicillins     REACTION: Hives  . Statins    Current Outpatient Prescriptions on File Prior to Visit  Medication Sig Dispense Refill  . cyclobenzaprine (FLEXERIL) 10 MG tablet Take 1 tablet (10 mg total) by mouth 3 (three) times daily as needed for muscle spasms.  90 tablet  1  . diclofenac (VOLTAREN) 75 MG EC tablet Take 1 tablet (75 mg total) by mouth 2 (two) times daily.  60 tablet  1  . hydrOXYzine (ATARAX/VISTARIL) 25 MG tablet Take 1 tablet (25 mg total) by mouth every 6 (six) hours as needed for itching.  30  tablet  3  . lisinopril-hydrochlorothiazide (PRINZIDE,ZESTORETIC) 20-25 MG per tablet Take 1 tablet by mouth daily.  30 tablet  1   No current facility-administered medications on file prior to visit.    Review of Systems  Constitutional: Negative for unusual diaphoresis or other sweats  HENT: Negative for ringing in ear Eyes: Negative for double vision or worsening visual disturbance.  Respiratory: Negative for choking and stridor.   Gastrointestinal: Negative for vomiting or other signifcant bowel change Genitourinary: Negative for hematuria or decreased urine volume.  Musculoskeletal: Negative for other MSK pain or swelling Skin: Negative for color change and worsening wound.  Neurological: Negative for tremors and numbness other than noted  Psychiatric/Behavioral: Negative for decreased concentration or agitation other than above       Objective:   Physical Exam  BP 140/90  Pulse 86  Temp(Src) 98.2 F (36.8 C) (Oral)  Wt 139 lb 8 oz (63.277 kg)  SpO2 97% VS noted,  Constitutional: Pt appears well-developed, well-nourished.  HENT: Head: NCAT.  Right Ear: External ear normal.  Left Ear: External ear normal.  Eyes: . Pupils are equal, round, and reactive to light. Conjunctivae and EOM are normal Neck: Normal range of motion. Neck supple.  Cardiovascular: Normal rate and regular rhythm.   Pulmonary/Chest: Effort normal and breath sounds  normal.  Abd:  Soft, NT, ND, + BS Neurological: Pt is alert. Not confused , motor grossly intact Skin: Skin is warm. No rash Psychiatric: Pt behavior is normal. No agitation.        Assessment & Plan:

## 2014-07-26 NOTE — Assessment & Plan Note (Signed)
Mod to severe, hct stopped, for f/u lab today, asympt

## 2014-07-26 NOTE — Progress Notes (Signed)
Pre visit review using our clinic review tool, if applicable. No additional management support is needed unless otherwise documented below in the visit note. 

## 2014-07-26 NOTE — Assessment & Plan Note (Signed)
Recent elevation a1c > 7 likely due to dietary excess; for better diet, f/u a1c next visit

## 2014-07-26 NOTE — Assessment & Plan Note (Signed)
Statin intol, cont lower chol diet

## 2014-07-26 NOTE — Assessment & Plan Note (Signed)
Increased today off the lisinopril hct; but stable overall by history and exam, recent data reviewed with pt, and pt to start lisinopril 20 ,   to f/u any worsening symptoms or concerns BP Readings from Last 3 Encounters:  07/26/14 140/90  07/19/14 110/80  08/23/13 118/80

## 2014-07-26 NOTE — Patient Instructions (Signed)
Please take all new medication as prescribed - the lisinopril 20 mg per day  Please continue all other medications as before, and refills have been done if requested.  Please have the pharmacy call with any other refills you may need.  Please continue your efforts at being more active, low cholesterol diet, and weight control.  You are otherwise up to date with prevention measures today.  Please keep your appointments with your specialists as you may have planned  Please stop the Sweet tea drinks as you mentioned  Please go to the LAB in the Basement (turn left off the elevator) for the tests to be done today  You will be contacted by phone if any changes need to be made immediately.  Otherwise, you will receive a letter about your results with an explanation, but please check with MyChart first.  Please remember to sign up for MyChart if you have not done so, as this will be important to you in the future with finding out test results, communicating by private email, and scheduling acute appointments online when needed.  Please return in 3 months, or sooner if needed

## 2014-07-27 ENCOUNTER — Telehealth: Payer: Self-pay | Admitting: Internal Medicine

## 2014-07-27 NOTE — Telephone Encounter (Signed)
emmi emailed °

## 2014-08-02 ENCOUNTER — Ambulatory Visit: Payer: Medicare Other | Admitting: *Deleted

## 2014-08-02 DIAGNOSIS — Z23 Encounter for immunization: Secondary | ICD-10-CM

## 2014-08-02 MED ORDER — PNEUMOCOCCAL 13-VAL CONJ VACC IM SUSP: 0.5000 mL | INTRAMUSCULAR | Status: DC

## 2014-10-26 ENCOUNTER — Ambulatory Visit: Payer: Medicare Other | Admitting: Internal Medicine

## 2014-10-31 ENCOUNTER — Ambulatory Visit (INDEPENDENT_AMBULATORY_CARE_PROVIDER_SITE_OTHER): Payer: Medicare Other | Admitting: Internal Medicine

## 2014-10-31 ENCOUNTER — Other Ambulatory Visit (INDEPENDENT_AMBULATORY_CARE_PROVIDER_SITE_OTHER): Payer: Medicare Other

## 2014-10-31 ENCOUNTER — Encounter: Payer: Self-pay | Admitting: Internal Medicine

## 2014-10-31 VITALS — BP 110/72 | HR 70 | Temp 98.2°F | Ht 67.0 in | Wt 137.1 lb

## 2014-10-31 DIAGNOSIS — E871 Hypo-osmolality and hyponatremia: Secondary | ICD-10-CM | POA: Diagnosis not present

## 2014-10-31 DIAGNOSIS — I10 Essential (primary) hypertension: Secondary | ICD-10-CM | POA: Diagnosis not present

## 2014-10-31 DIAGNOSIS — E782 Mixed hyperlipidemia: Secondary | ICD-10-CM | POA: Diagnosis not present

## 2014-10-31 DIAGNOSIS — E119 Type 2 diabetes mellitus without complications: Secondary | ICD-10-CM | POA: Diagnosis not present

## 2014-10-31 LAB — HEPATIC FUNCTION PANEL
ALT: 16 U/L (ref 0–53)
AST: 18 U/L (ref 0–37)
Albumin: 4.2 g/dL (ref 3.5–5.2)
Alkaline Phosphatase: 51 U/L (ref 39–117)
BILIRUBIN TOTAL: 0.6 mg/dL (ref 0.2–1.2)
Bilirubin, Direct: 0.1 mg/dL (ref 0.0–0.3)
Total Protein: 6.4 g/dL (ref 6.0–8.3)

## 2014-10-31 LAB — LIPID PANEL
CHOLESTEROL: 182 mg/dL (ref 0–200)
HDL: 34.5 mg/dL — ABNORMAL LOW (ref >39.00)
LDL CALC: 108 mg/dL — AB (ref 0–99)
NonHDL: 147.5
Total CHOL/HDL Ratio: 5
Triglycerides: 199 mg/dL — ABNORMAL HIGH (ref 0.0–149.0)
VLDL: 39.8 mg/dL (ref 0.0–40.0)

## 2014-10-31 LAB — BASIC METABOLIC PANEL
BUN: 13 mg/dL (ref 6–23)
CALCIUM: 9.2 mg/dL (ref 8.4–10.5)
CO2: 26 mEq/L (ref 19–32)
CREATININE: 0.93 mg/dL (ref 0.40–1.50)
Chloride: 93 mEq/L — ABNORMAL LOW (ref 96–112)
GFR: 86.58 mL/min (ref >60.00)
Glucose, Bld: 107 mg/dL — ABNORMAL HIGH (ref 70–99)
POTASSIUM: 4.5 meq/L (ref 3.5–5.1)
SODIUM: 127 meq/L — AB (ref 135–145)

## 2014-10-31 LAB — HEMOGLOBIN A1C: Hgb A1c MFr Bld: 7.1 % — ABNORMAL HIGH (ref 4.6–6.5)

## 2014-10-31 MED ORDER — DICLOFENAC SODIUM 75 MG PO TBEC: 75.0000 mg | DELAYED_RELEASE_TABLET | Freq: Two times a day (BID) | ORAL | Status: DC

## 2014-10-31 MED ORDER — CYCLOBENZAPRINE HCL 10 MG PO TABS: 10.0000 mg | ORAL_TABLET | Freq: Three times a day (TID) | ORAL | Status: DC | PRN

## 2014-10-31 NOTE — Assessment & Plan Note (Signed)
stable overall by history and exam, recent data reviewed with pt, and pt to continue medical treatment as before,  to f/u any worsening symptoms or concerns BP Readings from Last 3 Encounters:  10/31/14 110/72  07/26/14 140/90  07/19/14 110/80

## 2014-10-31 NOTE — Patient Instructions (Signed)

## 2014-10-31 NOTE — Progress Notes (Signed)
Pre visit review using our clinic review tool, if applicable. No additional management support is needed unless otherwise documented below in the visit note. 

## 2014-10-31 NOTE — Progress Notes (Signed)
   Subjective:    Patient ID: Jonathan Miranda, male    DOB: Jan 14, 1951, 64 y.o.   MRN: [id number]  HPI  Here to f/u; overall doing ok,  Pt denies chest pain, increased sob or doe, wheezing, orthopnea, PND, increased LE swelling, palpitations, dizziness or syncope.  Pt denies polydipsia, polyuria, or low sugar symptoms such as weakness or confusion improved with po intake.  Pt denies new neurological symptoms such as new headache, or facial or extremity weakness or numbness.   Pt states overall good compliance with meds, has been trying to follow lower cholesterol, diabetic diet, with wt overall stable,  Goes to gym regularly.  Gave up sweet tea, states a1c will likley be better Did also have low sodium previously with HCT - for f/u lab today.  Does c/o ongoing fatigue, nad has chronic signficant daytime hypersomnolence it seems, denies snoring and declines pulm referral r/o OSA.  Also has been stain intol in past - but might accept referral to lipid clinic if /fu ldl still elevated - goal < 70 Past Medical History  Diagnosis Date  . Prostatitis   . Rash   . Back pain   . Hyperlipidemia   . Hep A w/o coma   . Tinnitus   . Personal history of colonic polyps   . Nephrolithiasis   . Hypertension   . Allergic rhinitis    Past Surgical History  Procedure Laterality Date  . Orif wrist- '05    . Correction deviated septum    . Ureteroscopyp      reports that he has never smoked. He has never used smokeless tobacco. He reports that he drinks alcohol. He reports that he does not use illicit drugs. family history includes Diabetes in his father; Heart disease in his mother; Hyperlipidemia in his mother; Hypertension in his mother. Allergies  Allergen Reactions  . Iodine     REACTION: Hives  . Penicillins     REACTION: Hives  . Statins     Review of Systems  Constitutional: Negative for unusual diaphoresis or other sweats  HENT: Negative for ringing in ear Eyes: Negative for double  vision or worsening visual disturbance.  Respiratory: Negative for choking and stridor.   Gastrointestinal: Negative for vomiting or other signifcant bowel change Genitourinary: Negative for hematuria or decreased urine volume.  Musculoskeletal: Negative for other MSK pain or swelling Skin: Negative for color change and worsening wound.  Neurological: Negative for tremors and numbness other than noted  Psychiatric/Behavioral: Negative for decreased concentration or agitation other than above       Objective:   Physical Exam BP 110/72 mmHg  Pulse 70  Temp(Src) 98.2 F (36.8 C) (Oral)  Ht 5\' 7"  (1.702 m)  Wt 137 lb 2 oz (62.199 kg)  BMI 21.47 kg/m2  SpO2 96% VS noted,  Constitutional: Pt appears well-developed, well-nourished.  HENT: Head: NCAT.  Right Ear: External ear normal.  Left Ear: External ear normal.  Eyes: . Pupils are equal, round, and reactive to light. Conjunctivae and EOM are normal Neck: Normal range of motion. Neck supple.  Cardiovascular: Normal rate and regular rhythm.   Pulmonary/Chest: Effort normal and breath sounds without rales or wheezing.  Abd:  Soft, NT, ND, + BS Neurological: Pt is alert. Not confused , motor grossly intact Skin: Skin is warm. No rash Psychiatric: Pt behavior is normal. No agitation.       Assessment & Plan:

## 2014-10-31 NOTE — Assessment & Plan Note (Signed)
With better diet recent, stable overall by history and exam, recent data reviewed with pt, and pt to continue medical treatment as before,  to f/u any worsening symptoms or concerns, for f/u a1c

## 2014-10-31 NOTE — Assessment & Plan Note (Signed)
Statin intol in past, for f/u lab, consider lipid clinic referral, for lower chol diet

## 2014-10-31 NOTE — Assessment & Plan Note (Signed)
Likely related to prev HCT use - for f/u lab

## 2015-01-18 ENCOUNTER — Ambulatory Visit: Payer: Medicare Other | Admitting: Internal Medicine

## 2015-02-28 DIAGNOSIS — D1801 Hemangioma of skin and subcutaneous tissue: Secondary | ICD-10-CM | POA: Diagnosis not present

## 2015-02-28 DIAGNOSIS — D225 Melanocytic nevi of trunk: Secondary | ICD-10-CM | POA: Diagnosis not present

## 2015-02-28 DIAGNOSIS — L821 Other seborrheic keratosis: Secondary | ICD-10-CM | POA: Diagnosis not present

## 2015-02-28 DIAGNOSIS — C44219 Basal cell carcinoma of skin of left ear and external auricular canal: Secondary | ICD-10-CM | POA: Diagnosis not present

## 2015-02-28 DIAGNOSIS — L814 Other melanin hyperpigmentation: Secondary | ICD-10-CM | POA: Diagnosis not present

## 2015-03-22 DIAGNOSIS — M25551 Pain in right hip: Secondary | ICD-10-CM | POA: Diagnosis not present

## 2015-03-26 DIAGNOSIS — M545 Low back pain: Secondary | ICD-10-CM | POA: Diagnosis not present

## 2015-03-26 DIAGNOSIS — M25551 Pain in right hip: Secondary | ICD-10-CM | POA: Diagnosis not present

## 2015-05-01 ENCOUNTER — Other Ambulatory Visit (INDEPENDENT_AMBULATORY_CARE_PROVIDER_SITE_OTHER): Payer: Medicare Other

## 2015-05-01 ENCOUNTER — Encounter: Payer: Self-pay | Admitting: Internal Medicine

## 2015-05-01 ENCOUNTER — Ambulatory Visit (INDEPENDENT_AMBULATORY_CARE_PROVIDER_SITE_OTHER): Payer: Medicare Other | Admitting: Internal Medicine

## 2015-05-01 VITALS — BP 122/78 | HR 74 | Temp 97.8°F | Wt 138.2 lb

## 2015-05-01 DIAGNOSIS — I10 Essential (primary) hypertension: Secondary | ICD-10-CM | POA: Diagnosis not present

## 2015-05-01 DIAGNOSIS — E119 Type 2 diabetes mellitus without complications: Secondary | ICD-10-CM

## 2015-05-01 DIAGNOSIS — N320 Bladder-neck obstruction: Secondary | ICD-10-CM | POA: Diagnosis not present

## 2015-05-01 DIAGNOSIS — E782 Mixed hyperlipidemia: Secondary | ICD-10-CM

## 2015-05-01 DIAGNOSIS — E871 Hypo-osmolality and hyponatremia: Secondary | ICD-10-CM

## 2015-05-01 LAB — LDL CHOLESTEROL, DIRECT: Direct LDL: 152 mg/dL

## 2015-05-01 LAB — LIPID PANEL
Cholesterol: 220 mg/dL — ABNORMAL HIGH (ref 0–200)
HDL: 36.9 mg/dL — ABNORMAL LOW (ref >39.00)
NONHDL: 183.1
TRIGLYCERIDES: 217 mg/dL — AB (ref 0.0–149.0)
Total CHOL/HDL Ratio: 6
VLDL: 43.4 mg/dL — ABNORMAL HIGH (ref 0.0–40.0)

## 2015-05-01 LAB — CBC WITH DIFFERENTIAL/PLATELET
BASOS ABS: 0 10*3/uL (ref 0.0–0.1)
Basophils Relative: 0.5 % (ref 0.0–3.0)
Eosinophils Absolute: 0.1 10*3/uL (ref 0.0–0.7)
Eosinophils Relative: 0.7 % (ref 0.0–5.0)
HCT: 47.2 % (ref 39.0–52.0)
HEMOGLOBIN: 16 g/dL (ref 13.0–17.0)
Lymphocytes Relative: 26.2 % (ref 12.0–46.0)
Lymphs Abs: 2.4 10*3/uL (ref 0.7–4.0)
MCHC: 34 g/dL (ref 30.0–36.0)
MCV: 86.1 fl (ref 78.0–100.0)
Monocytes Absolute: 0.9 10*3/uL (ref 0.1–1.0)
Monocytes Relative: 10.4 % (ref 3.0–12.0)
NEUTROS PCT: 62.2 % (ref 43.0–77.0)
Neutro Abs: 5.6 10*3/uL (ref 1.4–7.7)
Platelets: 411 10*3/uL — ABNORMAL HIGH (ref 150.0–400.0)
RBC: 5.47 Mil/uL (ref 4.22–5.81)
RDW: 14.6 % (ref 11.5–15.5)
WBC: 9.1 10*3/uL (ref 4.0–10.5)

## 2015-05-01 LAB — BASIC METABOLIC PANEL
BUN: 11 mg/dL (ref 6–23)
CHLORIDE: 92 meq/L — AB (ref 96–112)
CO2: 31 mEq/L (ref 19–32)
Calcium: 9.6 mg/dL (ref 8.4–10.5)
Creatinine, Ser: 0.96 mg/dL (ref 0.40–1.50)
GFR: 83.34 mL/min (ref >60.00)
GLUCOSE: 122 mg/dL — AB (ref 70–99)
POTASSIUM: 4.9 meq/L (ref 3.5–5.1)
Sodium: 127 mEq/L — ABNORMAL LOW (ref 135–145)

## 2015-05-01 LAB — URINALYSIS, ROUTINE W REFLEX MICROSCOPIC
BILIRUBIN URINE: NEGATIVE
Hgb urine dipstick: NEGATIVE
KETONES UR: NEGATIVE
LEUKOCYTES UA: NEGATIVE
Nitrite: NEGATIVE
PH: 7 (ref 5.0–8.0)
Total Protein, Urine: NEGATIVE
URINE GLUCOSE: NEGATIVE
UROBILINOGEN UA: 0.2 (ref 0.0–1.0)

## 2015-05-01 LAB — HEPATIC FUNCTION PANEL
ALK PHOS: 57 U/L (ref 39–117)
ALT: 18 U/L (ref 0–53)
AST: 19 U/L (ref 0–37)
Albumin: 4.3 g/dL (ref 3.5–5.2)
Bilirubin, Direct: 0.1 mg/dL (ref 0.0–0.3)
Total Bilirubin: 0.7 mg/dL (ref 0.2–1.2)
Total Protein: 6.3 g/dL (ref 6.0–8.3)

## 2015-05-01 LAB — MICROALBUMIN / CREATININE URINE RATIO
Creatinine,U: 46.3 mg/dL
MICROALB/CREAT RATIO: 2.8 mg/g (ref 0.0–30.0)
Microalb, Ur: 1.3 mg/dL (ref 0.0–1.9)

## 2015-05-01 LAB — PSA: PSA: 1 ng/mL (ref 0.10–4.00)

## 2015-05-01 LAB — TSH: TSH: 3.05 u[IU]/mL (ref 0.35–4.50)

## 2015-05-01 LAB — HEMOGLOBIN A1C: Hgb A1c MFr Bld: 7.1 % — ABNORMAL HIGH (ref 4.6–6.5)

## 2015-05-01 NOTE — Assessment & Plan Note (Signed)
stable overall by history and exam, recent data reviewed with pt, and pt to continue medical treatment as before,  to f/u any worsening symptoms or concerns Lab Results  Component Value Date   HGBA1C 7.1* 10/31/2014   For /fu labs

## 2015-05-01 NOTE — Patient Instructions (Signed)

## 2015-05-01 NOTE — Progress Notes (Signed)
Pre visit review using our clinic review tool, if applicable. No additional management support is needed unless otherwise documented below in the visit note. 

## 2015-05-01 NOTE — Progress Notes (Signed)
Subjective:    Patient ID: Jonathan Miranda, male    DOB: 07/26/1951, 64 y.o.   MRN: [id number]  HPI  Here for yearly f/u;  Overall doing ok;  Pt denies Chest pain, worsening SOB, DOE, wheezing, orthopnea, PND, worsening LE edema, palpitations, dizziness or syncope.  Pt denies neurological change such as new headache, facial or extremity weakness.  Pt denies polydipsia, polyuria, or low sugar symptoms. Pt states overall good compliance with treatment and medications, good tolerability, and has been trying to follow appropriate diet.  Pt denies worsening depressive symptoms, suicidal ideation or panic. No fever, night sweats, wt loss, loss of appetite, or other constitutional symptoms.  Pt states good ability with ADL's, has low fall risk, home safety reviewed and adequate, no other significant changes in hearing or vision, and only occasionally active with exercise. Pt continues to have recurringright LBP with sciatica without change in severity, bowel or bladder change, fever, wt loss,  worsening LE pain/numbness/weakness, gait change or falls. Does c/o ongoing fatigue and lack of energy, and has signficant daytime hypersomnolence with mult naps per day short naps usually less than 1 hr, declines pulm referral for sleep apnea. Past Medical History  Diagnosis Date  . Prostatitis   . Rash   . Back pain   . Hyperlipidemia   . Hep A w/o coma   . Tinnitus   . Personal history of colonic polyps   . Nephrolithiasis   . Hypertension   . Allergic rhinitis    Past Surgical History  Procedure Laterality Date  . Orif wrist- '05    . Correction deviated septum    . Ureteroscopyp      reports that he has never smoked. He has never used smokeless tobacco. He reports that he drinks alcohol. He reports that he does not use illicit drugs. family history includes Diabetes in his father; Heart disease in his mother; Hyperlipidemia in his mother; Hypertension in his mother. Allergies  Allergen Reactions    . Iodine     REACTION: Hives  . Penicillins     REACTION: Hives  . Statins    Review of Systems Constitutional: Negative for increased diaphoresis, other activity, appetite or siginficant weight change other than noted HENT: Negative for worsening hearing loss, ear pain, facial swelling, mouth sores and neck stiffness.   Eyes: Negative for other worsening pain, redness or visual disturbance.  Respiratory: Negative for shortness of breath and wheezing  Cardiovascular: Negative for chest pain and palpitations.  Gastrointestinal: Negative for diarrhea, blood in stool, abdominal distention or other pain Genitourinary: Negative for hematuria, flank pain or change in urine volume.  Musculoskeletal: Negative for myalgias or other joint complaints.  Skin: Negative for color change and wound or drainage.  Neurological: Negative for syncope and numbness. other than noted Hematological: Negative for adenopathy. or other swelling Psychiatric/Behavioral: Negative for hallucinations, SI, self-injury, decreased concentration or other worsening agitation.      Objective:   Physical Exam BP 122/78 mmHg  Pulse 74  Temp(Src) 97.8 F (36.6 C)  Wt 138 lb 4 oz (62.71 kg)  SpO2 97% VS noted,  Constitutional: Pt is oriented to person, place, and time. Appears well-developed and well-nourished, in no significant distress Head: Normocephalic and atraumatic.  Right Ear: External ear normal.  Left Ear: External ear normal.  Nose: Nose normal.  Mouth/Throat: Oropharynx is clear and moist.  Eyes: Conjunctivae and EOM are normal. Pupils are equal, round, and reactive to light.  Neck: Normal range of  motion. Neck supple. No JVD present. No tracheal deviation present or significant neck LA or mass Cardiovascular: Normal rate, regular rhythm, normal heart sounds and intact distal pulses.   Pulmonary/Chest: Effort normal and breath sounds without rales or wheezing  Abdominal: Soft. Bowel sounds are normal.  NT. No HSM  Musculoskeletal: Normal range of motion. Exhibits no edema.  Lymphadenopathy:  Has no cervical adenopathy.  Neurological: Pt is alert and oriented to person, place, and time. Pt has normal reflexes. No cranial nerve deficit. Motor grossly intact Skin: Skin is warm and dry. No rash noted.  Psychiatric:  Has normal mood and affect. Behavior is normal.     Assessment & Plan:

## 2015-05-01 NOTE — Assessment & Plan Note (Signed)
stable overall by history and exam, recent data reviewed with pt, and pt to continue medical treatment as before,  to f/u any worsening symptoms or concerns Lab Results  Component Value Date   LDLCALC 108* 10/31/2014

## 2015-05-01 NOTE — Assessment & Plan Note (Signed)
Chronic persistent, likely SIADH, to follow

## 2015-06-26 DIAGNOSIS — L821 Other seborrheic keratosis: Secondary | ICD-10-CM | POA: Diagnosis not present

## 2015-06-26 DIAGNOSIS — Z85828 Personal history of other malignant neoplasm of skin: Secondary | ICD-10-CM | POA: Diagnosis not present

## 2015-06-26 DIAGNOSIS — D225 Melanocytic nevi of trunk: Secondary | ICD-10-CM | POA: Diagnosis not present

## 2015-06-26 DIAGNOSIS — Z08 Encounter for follow-up examination after completed treatment for malignant neoplasm: Secondary | ICD-10-CM | POA: Diagnosis not present

## 2015-07-11 ENCOUNTER — Other Ambulatory Visit: Payer: Self-pay | Admitting: Internal Medicine

## 2015-09-24 DIAGNOSIS — Z23 Encounter for immunization: Secondary | ICD-10-CM | POA: Diagnosis not present

## 2015-10-30 ENCOUNTER — Telehealth: Payer: Self-pay

## 2015-10-30 NOTE — Telephone Encounter (Signed)
Call to Jonathan Miranda; Still plans to see Dr. [name] Friday for CPE; will stay post apt for AWV around 1:30

## 2015-11-01 ENCOUNTER — Encounter: Payer: Self-pay | Admitting: Internal Medicine

## 2015-11-01 ENCOUNTER — Other Ambulatory Visit (INDEPENDENT_AMBULATORY_CARE_PROVIDER_SITE_OTHER): Payer: Medicare Other

## 2015-11-01 ENCOUNTER — Ambulatory Visit (INDEPENDENT_AMBULATORY_CARE_PROVIDER_SITE_OTHER): Payer: Medicare Other | Admitting: Internal Medicine

## 2015-11-01 VITALS — BP 120/78 | HR 83 | Temp 98.6°F | Resp 20 | Ht 67.0 in | Wt 132.0 lb

## 2015-11-01 DIAGNOSIS — Z1159 Encounter for screening for other viral diseases: Secondary | ICD-10-CM

## 2015-11-01 DIAGNOSIS — Z23 Encounter for immunization: Secondary | ICD-10-CM | POA: Diagnosis not present

## 2015-11-01 DIAGNOSIS — E119 Type 2 diabetes mellitus without complications: Secondary | ICD-10-CM | POA: Diagnosis not present

## 2015-11-01 DIAGNOSIS — Z0000 Encounter for general adult medical examination without abnormal findings: Secondary | ICD-10-CM

## 2015-11-01 DIAGNOSIS — N411 Chronic prostatitis: Secondary | ICD-10-CM | POA: Insufficient documentation

## 2015-11-01 DIAGNOSIS — I10 Essential (primary) hypertension: Secondary | ICD-10-CM | POA: Diagnosis not present

## 2015-11-01 DIAGNOSIS — E871 Hypo-osmolality and hyponatremia: Secondary | ICD-10-CM

## 2015-11-01 DIAGNOSIS — N320 Bladder-neck obstruction: Secondary | ICD-10-CM

## 2015-11-01 DIAGNOSIS — E782 Mixed hyperlipidemia: Secondary | ICD-10-CM

## 2015-11-01 DIAGNOSIS — R011 Cardiac murmur, unspecified: Secondary | ICD-10-CM | POA: Insufficient documentation

## 2015-11-01 DIAGNOSIS — Z1211 Encounter for screening for malignant neoplasm of colon: Secondary | ICD-10-CM

## 2015-11-01 LAB — LIPID PANEL
CHOL/HDL RATIO: 6
Cholesterol: 216 mg/dL — ABNORMAL HIGH (ref 0–200)
HDL: 37.4 mg/dL — AB (ref >39.00)
LDL Cholesterol: 142 mg/dL — ABNORMAL HIGH (ref 0–99)
NONHDL: 178.9
Triglycerides: 185 mg/dL — ABNORMAL HIGH (ref 0.0–149.0)
VLDL: 37 mg/dL (ref 0.0–40.0)

## 2015-11-01 LAB — BASIC METABOLIC PANEL
BUN: 10 mg/dL (ref 6–23)
CALCIUM: 10 mg/dL (ref 8.4–10.5)
CHLORIDE: 90 meq/L — AB (ref 96–112)
CO2: 32 mEq/L (ref 19–32)
CREATININE: 0.97 mg/dL (ref 0.40–1.50)
GFR: 82.22 mL/min (ref >60.00)
Glucose, Bld: 125 mg/dL — ABNORMAL HIGH (ref 70–99)
Potassium: 4.7 mEq/L (ref 3.5–5.1)
Sodium: 128 mEq/L — ABNORMAL LOW (ref 135–145)

## 2015-11-01 LAB — URINALYSIS, ROUTINE W REFLEX MICROSCOPIC
Bilirubin Urine: NEGATIVE
HGB URINE DIPSTICK: NEGATIVE
KETONES UR: NEGATIVE
Leukocytes, UA: NEGATIVE
NITRITE: NEGATIVE
Total Protein, Urine: NEGATIVE
UROBILINOGEN UA: 0.2 (ref 0.0–1.0)
Urine Glucose: NEGATIVE
pH: 7 (ref 5.0–8.0)

## 2015-11-01 LAB — HEPATIC FUNCTION PANEL
ALT: 19 U/L (ref 0–53)
AST: 17 U/L (ref 0–37)
Albumin: 4.9 g/dL (ref 3.5–5.2)
Alkaline Phosphatase: 61 U/L (ref 39–117)
BILIRUBIN DIRECT: 0.1 mg/dL (ref 0.0–0.3)
BILIRUBIN TOTAL: 0.7 mg/dL (ref 0.2–1.2)
Total Protein: 6.9 g/dL (ref 6.0–8.3)

## 2015-11-01 LAB — CBC WITH DIFFERENTIAL/PLATELET
BASOS PCT: 0.4 % (ref 0.0–3.0)
Basophils Absolute: 0 10*3/uL (ref 0.0–0.1)
Eosinophils Absolute: 0.1 10*3/uL (ref 0.0–0.7)
Eosinophils Relative: 0.5 % (ref 0.0–5.0)
HEMATOCRIT: 51.1 % (ref 39.0–52.0)
HEMOGLOBIN: 17.1 g/dL — AB (ref 13.0–17.0)
LYMPHS PCT: 20.2 % (ref 12.0–46.0)
Lymphs Abs: 2.3 10*3/uL (ref 0.7–4.0)
MCHC: 33.4 g/dL (ref 30.0–36.0)
MCV: 86.9 fl (ref 78.0–100.0)
MONOS PCT: 9.2 % (ref 3.0–12.0)
Monocytes Absolute: 1.1 10*3/uL — ABNORMAL HIGH (ref 0.1–1.0)
NEUTROS ABS: 8 10*3/uL — AB (ref 1.4–7.7)
Neutrophils Relative %: 69.7 % (ref 43.0–77.0)
PLATELETS: 413 10*3/uL — AB (ref 150.0–400.0)
RBC: 5.88 Mil/uL — ABNORMAL HIGH (ref 4.22–5.81)
RDW: 14 % (ref 11.5–15.5)
WBC: 11.5 10*3/uL — ABNORMAL HIGH (ref 4.0–10.5)

## 2015-11-01 LAB — PSA: PSA: 1.33 ng/mL (ref 0.10–4.00)

## 2015-11-01 LAB — MICROALBUMIN / CREATININE URINE RATIO
CREATININE, U: 36.2 mg/dL
MICROALB UR: 2.8 mg/dL — AB (ref 0.0–1.9)
MICROALB/CREAT RATIO: 7.7 mg/g (ref 0.0–30.0)

## 2015-11-01 LAB — TSH: TSH: 2.54 u[IU]/mL (ref 0.35–4.50)

## 2015-11-01 LAB — HEMOGLOBIN A1C: HEMOGLOBIN A1C: 7.6 % — AB (ref 4.6–6.5)

## 2015-11-01 MED ORDER — DOXYCYCLINE HYCLATE 100 MG PO TABS: 100.0000 mg | ORAL_TABLET | Freq: Two times a day (BID) | ORAL | Status: DC

## 2015-11-01 NOTE — Patient Instructions (Addendum)
Your EKG was OK today  Please continue all other medications as before, and refills have been done if requested - the doxycycline  Please have the pharmacy call with any other refills you may need.  Please continue your efforts at being more active, low cholesterol diet, and weight control.  You are otherwise up to date with prevention measures today.  Please keep your appointments with your specialists as you may have planned  You will be contacted regarding the referral for: colonoscopy, and Echocardiogram  Please go to the LAB in the Basement (turn left off the elevator) for the tests to be done today  You will be contacted by phone if any changes need to be made immediately.  Otherwise, you will receive a letter about your results with an explanation, but please check with MyChart first.  Please remember to sign up for MyChart if you have not done so, as this will be important to you in the future with finding out test results, communicating by private email, and scheduling acute appointments online when needed.  Please return in 6 months, or sooner if needed   Jonathan Miranda , Thank you for taking time to come for your Medicare Wellness Visit. I appreciate your ongoing commitment to your health goals. Please review the following plan we discussed and let me know if I can assist you in the future.   Will fup on A1c and continue good eating habit and exercise  Will take PSV 23; Pneumovax today These are the goals we discussed: Goals    . will maintain health      Will continue healthy habits       This is a list of the screening recommended for you and due dates:  Health Maintenance  Topic Date Due  .  Hepatitis C: One time screening is recommended by Center for Disease Control  (CDC) for  adults born from [birth year] through 1967.   1951/02/12  . Eye exam for diabetics  07/08/1959  . Pneumonia vaccines (2 of 2 - PPSV23) 08/03/2015  . Colon Cancer Screening  10/06/2015  .  Hemoglobin A1C  11/01/2015  . Complete foot exam   04/30/2016  . Flu Shot  05/05/2016  . Tetanus Vaccine  07/27/2022  . Shingles Vaccine  Addressed

## 2015-11-01 NOTE — Progress Notes (Signed)
Subjective:   Jonathan Miranda is a 65 y.o. male who presents for Medicare Annual/Subsequent preventive examination.  Review of Systems:  HRA assessment completed during visit; [place]; [place] The Patient was informed that this wellness visit is to identify risk and educate on how to reduce risk for increase disease through lifestyle changes.   ROS deferred to CPE exam with physician today  Medical issues  HTN; bp in good control  Hyperlipidemia: chol 220; Trig 217; HDL 36; LDL 152 (A1c 7.1 2016)   BMI: 20.7 Diet; proactive with health; In 2005 when BP was very high; 180/ 120; lost 25 lbs and BP normalized; Eats well; Exercises and feels good about his health; has energy Breakfast cereal and green tea;  Lunch; sandwich and supper has a meal; salads  Has snack at hs   Exercise; walks 50 min on treadmill 2 to 3 times a week Walk 3 miles   SAFETY Safety reviewed for the home;  Removal of clutter clearing paths through the home, completed Bathroom safety; separate shower Community safety; yes Smoke detectors yes Firearms safety reviewed Driving accidents and seatbelt/ no Sun protection/ going to the beach wears sun lotion Stressors; 2;   Medication review per dr. [name]  [facility] exercises walking through [facility] area; did not get hurt  Gait assessment/ no limitations noted  Mobilization and Functional losses in the last year; no Sleep patterns; sleeps well   Urinary or fecal incontinence reviewed no   Counseling: Hep c; Declined  Colonoscopy; due 10/2015 EKG: 07/24/2010 Hearing: no  Ophthalmology exam; June 2017 Immunizations Due: PSV 23  Current Care Team reviewed and updated   Cardiac Risk Factors include: advanced age (>70mn, >>4women);diabetes mellitus     Objective:    Vitals: BP 120/78 mmHg  Pulse 83  Temp(Src) 98.6 F (37 C) (Oral)  Resp 20  Ht '5\' 7"'$  (1.702 m)  Wt 132 lb (59.875 kg)  BMI 20.67 kg/m2  SpO2  97%  Tobacco History  Smoking status  . Never Smoker   Smokeless tobacco  . Never Used     Counseling given: Not Answered   Past Medical History  Diagnosis Date  . Prostatitis   . Rash   . Back pain   . Hyperlipidemia   . Hep A w/o coma   . Tinnitus   . Personal history of colonic polyps   . Nephrolithiasis   . Hypertension   . Allergic rhinitis    Past Surgical History  Procedure Laterality Date  . Orif wrist- '05    . Correction deviated septum    . Ureteroscopyp     Family History  Problem Relation Age of Onset  . Heart disease Mother   . Hyperlipidemia Mother   . Hypertension Mother   . Diabetes Father    History  Sexual Activity  . Sexual Activity: Yes    Outpatient Encounter Prescriptions as of 11/01/2015  Medication Sig  . cyclobenzaprine (FLEXERIL) 10 MG tablet Take 1 tablet (10 mg total) by mouth 3 (three) times daily as needed for muscle spasms.  . diclofenac (VOLTAREN) 75 MG EC tablet Take 1 tablet (75 mg total) by mouth 2 (two) times daily.  . hydrOXYzine (ATARAX/VISTARIL) 25 MG tablet Take 1 tablet (25 mg total) by mouth every 6 (six) hours as needed for itching.  .Marland Kitchenlisinopril (PRINIVIL,ZESTRIL) 20 MG tablet TAKE 1 TABLET BY MOUTH DAILY  . doxycycline (VIBRA-TABS) 100 MG tablet Take 1 tablet (100 mg  total) by mouth 2 (two) times daily. As needed for recurrent prostatitis  . [DISCONTINUED] pneumococcal 13-valent conjugate vaccine (PREVNAR 13) injection 0.5 mL    No facility-administered encounter medications on file as of 11/01/2015.    Activities of Daily Living In your present state of health, do you have any difficulty performing the following activities: 11/01/2015  Hearing? N  Vision? (No Data)  Difficulty concentrating or making decisions? N  Walking or climbing stairs? Y  Dressing or bathing? N  Doing errands, shopping? N  Preparing Food and eating ? N  Using the Toilet? N  In the past six months, have you accidently leaked urine? N   Do you have problems with loss of bowel control? N  Managing your Medications? N  Managing your Finances? N  Housekeeping or managing your Housekeeping? N    Patient Care Team: Biagio Borg, MD as PCP - General (Internal Medicine)   Assessment:     Assessment   Patient presents for yearly preventative medicine examination. Medicare questionnaire screening were completed, i.e. Functional; fall risk; depression, memory loss and hearing were all unremarkable   All immunizations and health maintenance protocols were reviewed with the patient and the patient took his PSV 23 today; which is his last pneumonia vaccine.  Education provided for laboratory screens;  Discussed A1c and will fup on labs today; Eats very well; has had no issues; Only eats a snack at hs; Limits sugar intake  Medication reconciliation, past medical history, social history, problem list and allergies were reviewed in detail with the patient  Goals were established and the patient will continue current exercise and monitor weight;  End of life planning was discussed and has been completed    Exercise Activities and Dietary recommendations Current Exercise Habits:: Structured exercise class, Type of exercise: walking, Time (Minutes): 55, Frequency (Times/Week): 3, Weekly Exercise (Minutes/Week): 165, Intensity: Moderate  Goals    . will maintain health      Will continue healthy habits      Fall Risk Fall Risk  11/01/2015 11/01/2015 05/01/2015 07/19/2014  Falls in the past year? Yes No No No  Number falls in past yr: 1 - - -  Injury with Fall? No - - -  Follow up Education provided - - [place] [facility]  Depression Screen PHQ 2/9 Scores 11/01/2015 11/01/2015 05/01/2015 07/19/2014  PHQ - 2 Score 0 0 0 0    Cognitive Testing No flowsheet data found. AD8 Score 0; no issues  Immunization History  Administered Date(s) Administered  . Influenza Split 07/27/2012  . Influenza Whole 07/13/2008,  07/06/2009, 07/24/2010  . Influenza,inj,Quad PF,36+ Mos 08/23/2013, 07/19/2014  . Influenza-Unspecified 09/24/2015  . Pneumococcal Conjugate-13 08/02/2014  . Pneumococcal Polysaccharide-23 07/13/2008, 11/01/2015  . Td 10/05/2000  . Tdap 07/27/2012   Screening Tests Health Maintenance  Topic Date Due  . Hepatitis C Screening  1951/03/12  . OPHTHALMOLOGY EXAM  07/08/1959  . PNA vac Low Risk Adult (2 of 2 - PPSV23) 08/03/2015  . COLONOSCOPY  10/06/2015  . HEMOGLOBIN A1C  11/01/2015  . FOOT EXAM  04/30/2016  . INFLUENZA VACCINE  05/05/2016  . TETANUS/TDAP  07/27/2022  . ZOSTAVAX  Addressed      Plan:    During the course of the visit the patient was educated and counseled about the following appropriate screening and preventive services:   Vaccines to include Pneumoccal, Influenza, Hepatitis B, Td, Zostavax, HCV  Electrocardiogram 10/2015  Cardiovascular Disease/ BP  in good control  Colorectal cancer screening/ referred for colonoscopy   Diabetes screening/in process  Prostate Cancer Screening/ in process  Glaucoma screening/ due in June 2017   Nutrition counseling / great; limited sugar  Smoking cessation counseling/ does not smoke  Patient Instructions (the written plan) was given to the patient.    Wynetta Fines, RN  11/01/2015   Medical screening examination/treatment/procedure(s) were performed by non-physician practitioner and as supervising physician I was immediately available for consultation/collaboration. I agree with above. Cathlean Cower, MD

## 2015-11-01 NOTE — Progress Notes (Signed)
Pre visit review using our clinic review tool, if applicable. No additional management support is needed unless otherwise documented below in the visit note. 

## 2015-11-01 NOTE — Progress Notes (Signed)
Subjective:    Patient ID: Jonathan Miranda, male    DOB: [birth year], 65 y.o.   MRN: [id number]  HPI  Here for yearly f/u;  Overall doing ok;  Pt denies Chest pain, worsening SOB, DOE, wheezing, orthopnea, PND, worsening LE edema, palpitations, dizziness or syncope.  Pt denies neurological change such as new headache, facial or extremity weakness.  Pt denies polydipsia, polyuria, or low sugar symptoms. Pt states overall good compliance with treatment and medications, good tolerability, and has been trying to follow appropriate diet.  Pt denies worsening depressive symptoms, suicidal ideation or panic. No fever, night sweats, wt loss, loss of appetite, or other constitutional symptoms.  Pt states good ability with ADL's, has low fall risk, home safety reviewed and adequate, no other significant changes in hearing or vision, and only occasionally active with exercise.  Declines Hep c screening.  Does ask for doxy refill to take prn few pills when prostate symptoms recur as this has been effective in the past.  Admits to drinking "lots of water" every day Past Medical History  Diagnosis Date  . Prostatitis   . Rash   . Back pain   . Hyperlipidemia   . Hep A w/o coma   . Tinnitus   . Personal history of colonic polyps   . Nephrolithiasis   . Hypertension   . Allergic rhinitis    Past Surgical History  Procedure Laterality Date  . Orif wrist- '05    . Correction deviated septum    . Ureteroscopyp      reports that he has never smoked. He has never used smokeless tobacco. He reports that he drinks alcohol. He reports that he does not use illicit drugs. family history includes Diabetes in his father; Heart disease in his mother; Hyperlipidemia in his mother; Hypertension in his mother. Allergies  Allergen Reactions  . Iodine     REACTION: Hives  . Penicillins     REACTION: Hives  . Statins    Current Outpatient Prescriptions on File Prior to Visit  Medication Sig Dispense Refill  .  cyclobenzaprine (FLEXERIL) 10 MG tablet Take 1 tablet (10 mg total) by mouth 3 (three) times daily as needed for muscle spasms. 90 tablet 1  . diclofenac (VOLTAREN) 75 MG EC tablet Take 1 tablet (75 mg total) by mouth 2 (two) times daily. 60 tablet 1  . hydrOXYzine (ATARAX/VISTARIL) 25 MG tablet Take 1 tablet (25 mg total) by mouth every 6 (six) hours as needed for itching. 30 tablet 3  . lisinopril (PRINIVIL,ZESTRIL) 20 MG tablet TAKE 1 TABLET BY MOUTH DAILY 90 tablet 1   No current facility-administered medications on file prior to visit.   Review of Systems Constitutional: Negative for increased diaphoresis, other activity, appetite or siginficant weight change other than noted HENT: Negative for worsening hearing loss, ear pain, facial swelling, mouth sores and neck stiffness.   Eyes: Negative for other worsening pain, redness or visual disturbance.  Respiratory: Negative for shortness of breath and wheezing  Cardiovascular: Negative for chest pain and palpitations.  Gastrointestinal: Negative for diarrhea, blood in stool, abdominal distention or other pain Genitourinary: Negative for hematuria, flank pain or change in urine volume.  Musculoskeletal: Negative for myalgias or other joint complaints.  Skin: Negative for color change and wound or drainage.  Neurological: Negative for syncope and numbness. other than noted Hematological: Negative for adenopathy. or other swelling Psychiatric/Behavioral: Negative for hallucinations, SI, self-injury, decreased concentration or other worsening agitation.  Objective:   Physical Exam BP 120/78 mmHg  Pulse 83  Temp(Src) 98.6 F (37 C) (Oral)  Resp 20  Ht '5\' 7"'$  (1.702 m)  Wt 132 lb (59.875 kg)  BMI 20.67 kg/m2  SpO2 97% VS noted,  Constitutional: Pt is oriented to person, place, and time. Appears well-developed and well-nourished, in no significant distress Head: Normocephalic and atraumatic.  Right Ear: External ear normal.  Left  Ear: External ear normal.  Nose: Nose normal.  Mouth/Throat: Oropharynx is clear and moist.  Eyes: Conjunctivae and EOM are normal. Pupils are equal, round, and reactive to light.  Neck: Normal range of motion. Neck supple. No JVD present. No tracheal deviation present or significant neck LA or mass Cardiovascular: Normal rate, regular rhythm, normal heart sounds and intact distal pulses.  except gr 2/6 syst murmur RUSB Pulmonary/Chest: Effort normal and breath sounds without rales or wheezing  Abdominal: Soft. Bowel sounds are normal. NT. No HSM  Musculoskeletal: Normal range of motion. Exhibits no edema.  Lymphadenopathy:  Has no cervical adenopathy.  Neurological: Pt is alert and oriented to person, place, and time. Pt has normal reflexes. No cranial nerve deficit. Motor grossly intact Skin: Skin is warm and dry. No rash noted.  Psychiatric:  Has nervous mood and affect. Behavior is normal.     Assessment & Plan:

## 2015-11-02 NOTE — Assessment & Plan Note (Signed)
stable overall by history and exam, recent data reviewed with pt, and pt to continue medical treatment as before,  to f/u any worsening symptoms or concerns. BP Readings from Last 3 Encounters:  11/01/15 120/78  05/01/15 122/78  10/31/14 110/72

## 2015-11-02 NOTE — Assessment & Plan Note (Signed)
Mild to mod, for doxy prn as has taken in past,  to f/u any worsening symptoms or concerns

## 2015-11-02 NOTE — Assessment & Plan Note (Signed)
stable overall by history and exam, recent data reviewed with pt, and pt to continue medical treatment as before,  to f/u any worsening symptoms or concerns Lab Results  Component Value Date   LDLCALC 142* 11/01/2015   Has been statin intolerant, for low chol diet

## 2015-11-02 NOTE — Assessment & Plan Note (Signed)
asympt without cp or dizziness, but possibly new, for echo, ECG reviewed as per emr

## 2015-11-02 NOTE — Assessment & Plan Note (Signed)
stable overall by history and exam, recent data reviewed with pt, and pt to continue medical treatment as before,  to f/u any worsening symptoms or concerns Lab Results  Component Value Date   HGBA1C 7.6* 11/01/2015   For f/u lab

## 2015-11-02 NOTE — Assessment & Plan Note (Addendum)
Chronic persistent, persists off diuretic, ? SIADH but suspect due to polydipsia possible psychiatric in etiology, for f/u lab today   Note:  Total time for pt hx, exam, review of record with pt in the room, determination of diagnoses and plan for further eval and tx is > 40 min, with over 50% spent in coordination and counseling of patient

## 2015-11-27 ENCOUNTER — Other Ambulatory Visit: Payer: Self-pay

## 2015-11-27 ENCOUNTER — Ambulatory Visit (HOSPITAL_COMMUNITY): Payer: Medicare Other | Attending: Cardiology

## 2015-11-27 DIAGNOSIS — I059 Rheumatic mitral valve disease, unspecified: Secondary | ICD-10-CM | POA: Insufficient documentation

## 2015-11-27 DIAGNOSIS — E785 Hyperlipidemia, unspecified: Secondary | ICD-10-CM | POA: Insufficient documentation

## 2015-11-27 DIAGNOSIS — E119 Type 2 diabetes mellitus without complications: Secondary | ICD-10-CM | POA: Diagnosis not present

## 2015-11-27 DIAGNOSIS — R011 Cardiac murmur, unspecified: Secondary | ICD-10-CM | POA: Diagnosis not present

## 2015-11-27 DIAGNOSIS — I350 Nonrheumatic aortic (valve) stenosis: Secondary | ICD-10-CM | POA: Diagnosis not present

## 2015-11-27 DIAGNOSIS — I119 Hypertensive heart disease without heart failure: Secondary | ICD-10-CM | POA: Diagnosis not present

## 2015-11-28 ENCOUNTER — Encounter: Payer: Self-pay | Admitting: Internal Medicine

## 2015-11-28 DIAGNOSIS — I350 Nonrheumatic aortic (valve) stenosis: Secondary | ICD-10-CM | POA: Insufficient documentation

## 2015-11-28 HISTORY — DX: Nonrheumatic aortic (valve) stenosis: I35.0

## 2016-01-12 ENCOUNTER — Other Ambulatory Visit: Payer: Self-pay | Admitting: Internal Medicine

## 2016-03-30 DIAGNOSIS — L853 Xerosis cutis: Secondary | ICD-10-CM | POA: Diagnosis not present

## 2016-03-30 DIAGNOSIS — L814 Other melanin hyperpigmentation: Secondary | ICD-10-CM | POA: Diagnosis not present

## 2016-03-30 DIAGNOSIS — D225 Melanocytic nevi of trunk: Secondary | ICD-10-CM | POA: Diagnosis not present

## 2016-03-30 DIAGNOSIS — L821 Other seborrheic keratosis: Secondary | ICD-10-CM | POA: Diagnosis not present

## 2016-05-01 ENCOUNTER — Ambulatory Visit: Payer: Medicare Other | Admitting: Internal Medicine

## 2016-06-25 ENCOUNTER — Other Ambulatory Visit: Payer: Self-pay | Admitting: Internal Medicine

## 2016-06-25 DIAGNOSIS — M5442 Lumbago with sciatica, left side: Secondary | ICD-10-CM | POA: Diagnosis not present

## 2016-06-25 DIAGNOSIS — M25572 Pain in left ankle and joints of left foot: Secondary | ICD-10-CM | POA: Diagnosis not present

## 2016-07-03 DIAGNOSIS — Z23 Encounter for immunization: Secondary | ICD-10-CM | POA: Diagnosis not present

## 2016-07-05 ENCOUNTER — Other Ambulatory Visit: Payer: Self-pay | Admitting: Internal Medicine

## 2016-09-18 ENCOUNTER — Other Ambulatory Visit: Payer: Self-pay | Admitting: Internal Medicine

## 2016-09-24 ENCOUNTER — Other Ambulatory Visit: Payer: Self-pay | Admitting: Internal Medicine

## 2016-10-08 ENCOUNTER — Other Ambulatory Visit: Payer: Self-pay | Admitting: Internal Medicine

## 2016-11-04 ENCOUNTER — Other Ambulatory Visit (INDEPENDENT_AMBULATORY_CARE_PROVIDER_SITE_OTHER): Payer: Medicare Other

## 2016-11-04 ENCOUNTER — Encounter: Payer: Self-pay | Admitting: Internal Medicine

## 2016-11-04 ENCOUNTER — Ambulatory Visit (INDEPENDENT_AMBULATORY_CARE_PROVIDER_SITE_OTHER): Payer: Medicare Other | Admitting: Internal Medicine

## 2016-11-04 ENCOUNTER — Other Ambulatory Visit: Payer: Self-pay | Admitting: Internal Medicine

## 2016-11-04 VITALS — BP 130/70 | HR 85 | Temp 98.2°F | Resp 20 | Wt 135.0 lb

## 2016-11-04 DIAGNOSIS — I10 Essential (primary) hypertension: Secondary | ICD-10-CM | POA: Diagnosis not present

## 2016-11-04 DIAGNOSIS — E119 Type 2 diabetes mellitus without complications: Secondary | ICD-10-CM

## 2016-11-04 DIAGNOSIS — I350 Nonrheumatic aortic (valve) stenosis: Secondary | ICD-10-CM | POA: Diagnosis not present

## 2016-11-04 DIAGNOSIS — H9192 Unspecified hearing loss, left ear: Secondary | ICD-10-CM | POA: Insufficient documentation

## 2016-11-04 DIAGNOSIS — H918X2 Other specified hearing loss, left ear: Secondary | ICD-10-CM

## 2016-11-04 DIAGNOSIS — N320 Bladder-neck obstruction: Secondary | ICD-10-CM | POA: Diagnosis not present

## 2016-11-04 LAB — CBC WITH DIFFERENTIAL/PLATELET
BASOS ABS: 0.1 10*3/uL (ref 0.0–0.1)
BASOS PCT: 0.8 % (ref 0.0–3.0)
EOS ABS: 0.2 10*3/uL (ref 0.0–0.7)
Eosinophils Relative: 1.6 % (ref 0.0–5.0)
HCT: 47.2 % (ref 39.0–52.0)
HEMOGLOBIN: 16.7 g/dL (ref 13.0–17.0)
LYMPHS PCT: 28.1 % (ref 12.0–46.0)
Lymphs Abs: 3 10*3/uL (ref 0.7–4.0)
MCHC: 35.4 g/dL (ref 30.0–36.0)
MCV: 86 fl (ref 78.0–100.0)
MONO ABS: 1.2 10*3/uL — AB (ref 0.1–1.0)
Monocytes Relative: 11.1 % (ref 3.0–12.0)
Neutro Abs: 6.2 10*3/uL (ref 1.4–7.7)
Neutrophils Relative %: 58.4 % (ref 43.0–77.0)
Platelets: 354 10*3/uL (ref 150.0–400.0)
RBC: 5.49 Mil/uL (ref 4.22–5.81)
RDW: 13.6 % (ref 11.5–15.5)
WBC: 10.6 10*3/uL — AB (ref 4.0–10.5)

## 2016-11-04 LAB — URINALYSIS, ROUTINE W REFLEX MICROSCOPIC
Bilirubin Urine: NEGATIVE
HGB URINE DIPSTICK: NEGATIVE
KETONES UR: NEGATIVE
Leukocytes, UA: NEGATIVE
Nitrite: NEGATIVE
RBC / HPF: NONE SEEN (ref 0–?)
Specific Gravity, Urine: 1.01 (ref 1.000–1.030)
TOTAL PROTEIN, URINE-UPE24: NEGATIVE
URINE GLUCOSE: NEGATIVE
UROBILINOGEN UA: 0.2 (ref 0.0–1.0)
pH: 7.5 (ref 5.0–8.0)

## 2016-11-04 LAB — BASIC METABOLIC PANEL
BUN: 16 mg/dL (ref 6–23)
CHLORIDE: 95 meq/L — AB (ref 96–112)
CO2: 32 mEq/L (ref 19–32)
CREATININE: 1.2 mg/dL (ref 0.40–1.50)
Calcium: 9.7 mg/dL (ref 8.4–10.5)
GFR: 64.12 mL/min (ref >60.00)
GLUCOSE: 123 mg/dL — AB (ref 70–99)
Potassium: 4.4 mEq/L (ref 3.5–5.1)
Sodium: 131 mEq/L — ABNORMAL LOW (ref 135–145)

## 2016-11-04 LAB — HEPATIC FUNCTION PANEL
ALBUMIN: 4.6 g/dL (ref 3.5–5.2)
ALT: 22 U/L (ref 0–53)
AST: 16 U/L (ref 0–37)
Alkaline Phosphatase: 55 U/L (ref 39–117)
BILIRUBIN TOTAL: 0.6 mg/dL (ref 0.2–1.2)
Bilirubin, Direct: 0.1 mg/dL (ref 0.0–0.3)
TOTAL PROTEIN: 6.6 g/dL (ref 6.0–8.3)

## 2016-11-04 LAB — TSH: TSH: 3.01 u[IU]/mL (ref 0.35–4.50)

## 2016-11-04 LAB — HEMOGLOBIN A1C: HEMOGLOBIN A1C: 7.6 % — AB (ref 4.6–6.5)

## 2016-11-04 LAB — MICROALBUMIN / CREATININE URINE RATIO
CREATININE, U: 51.6 mg/dL
Microalb Creat Ratio: 9.7 mg/g (ref 0.0–30.0)
Microalb, Ur: 5 mg/dL — ABNORMAL HIGH (ref 0.0–1.9)

## 2016-11-04 LAB — LIPID PANEL
CHOL/HDL RATIO: 8
CHOLESTEROL: 271 mg/dL — AB (ref 0–200)
HDL: 34.4 mg/dL — ABNORMAL LOW (ref >39.00)
NonHDL: 236.67
TRIGLYCERIDES: 391 mg/dL — AB (ref 0.0–149.0)
VLDL: 78.2 mg/dL — ABNORMAL HIGH (ref 0.0–40.0)

## 2016-11-04 LAB — PSA: PSA: 1.19 ng/mL (ref 0.10–4.00)

## 2016-11-04 LAB — LDL CHOLESTEROL, DIRECT: Direct LDL: 180 mg/dL

## 2016-11-04 MED ORDER — ASPIRIN EC 81 MG PO TBEC: 81.0000 mg | DELAYED_RELEASE_TABLET | Freq: Every day | ORAL | 11 refills | Status: DC

## 2016-11-04 MED ORDER — METFORMIN HCL ER 500 MG PO TB24: 500.0000 mg | ORAL_TABLET | Freq: Every day | ORAL | 3 refills | Status: DC

## 2016-11-04 NOTE — Telephone Encounter (Signed)
Please also let pt know he should start:  Asa 81 mg - 1 per day to help reduce risk of heart disease and stroke

## 2016-11-04 NOTE — Progress Notes (Signed)
Pre visit review using our clinic review tool, if applicable. No additional management support is needed unless otherwise documented below in the visit note. 

## 2016-11-04 NOTE — Progress Notes (Signed)
Subjective:    Patient ID: Jonathan Miranda, male    DOB: 1951-05-09, 66 y.o.   MRN: [id number]  HPI  Here for yearly f/u;  Overall doing ok;  Pt denies Chest pain, worsening SOB, DOE, wheezing, orthopnea, PND, worsening LE edema, palpitations, dizziness or syncope.  Pt denies neurological change such as new headache, facial or extremity weakness.  Pt denies polydipsia, polyuria, or low sugar symptoms. Pt states overall good compliance with treatment and medications, good tolerability, and has been trying to follow appropriate diet.  Pt denies worsening depressive symptoms, suicidal ideation or panic. No fever, night sweats, wt loss, loss of appetite, or other constitutional symptoms.  Pt states good ability with ADL's, has low fall risk, home safety reviewed and adequate, no other significant changes in vision, and only occasionally active with exercise. Does have left ear hearing loss in the last wk, concerned about another wax impaction as before. No pain, fever, sinus pressure, ST or cough.   Past Medical History:  Diagnosis Date  . Allergic rhinitis   . Aortic stenosis, mild 11/28/2015  . Back pain   . Hep A w/o coma   . Hyperlipidemia   . Hypertension   . Nephrolithiasis   . Personal history of colonic polyps   . Prostatitis   . Rash   . Tinnitus    Past Surgical History:  Procedure Laterality Date  . correction deviated septum    . ORIF wrist- '05    . ureteroscopyp      reports that he has never smoked. He has never used smokeless tobacco. He reports that he drinks alcohol. He reports that he does not use drugs. family history includes Diabetes in his father; Heart disease in his mother; Hyperlipidemia in his mother; Hypertension in his mother. Allergies  Allergen Reactions  . Iodine     REACTION: Hives  . Penicillins     REACTION: Hives  . Statins    Current Outpatient Prescriptions on File Prior to Visit  Medication Sig Dispense Refill  . cyclobenzaprine (FLEXERIL)  10 MG tablet TAKE 1 TABLET BY MOUTH THREE TIMES DAILY AS NEEDED FOR MUSCLE SPASMS 90 tablet 0  . diclofenac (VOLTAREN) 75 MG EC tablet TAKE 1 TABLET BY MOUTH TWICE DAILY 180 tablet 0  . doxycycline (VIBRA-TABS) 100 MG tablet TAKE 1 TABLET BY MOUTH TWICE DAILY AS NEEDED FOR RECURRENT PROSTATITIS 20 tablet 0  . hydrOXYzine (ATARAX/VISTARIL) 25 MG tablet Take 1 tablet (25 mg total) by mouth every 6 (six) hours as needed for itching. 30 tablet 3  . lisinopril (PRINIVIL,ZESTRIL) 20 MG tablet TAKE 1 TABLET BY MOUTH DAILY 90 tablet 0   No current facility-administered medications on file prior to visit.     Review of Systems Constitutional: Negative for increased diaphoresis, or other activity, appetite or siginficant weight change other than noted HENT: Negative for worsening hearing loss, ear pain, facial swelling, mouth sores and neck stiffness.   Eyes: Negative for other worsening pain, redness or visual disturbance.  Respiratory: Negative for choking or stridor Cardiovascular: Negative for other chest pain and palpitations.  Gastrointestinal: Negative for worsening diarrhea, blood in stool, or abdominal distention Genitourinary: Negative for hematuria, flank pain or change in urine volume.  Musculoskeletal: Negative for myalgias or other joint complaints.  Skin: Negative for other color change and wound or drainage.  Neurological: Negative for syncope and numbness. other than noted Hematological: Negative for adenopathy. or other swelling Psychiatric/Behavioral: Negative for hallucinations, SI, self-injury, decreased concentration or  other worsening agitation.  All other system neg per pt    Objective:   Physical Exam BP 130/70   Pulse 85   Temp 98.2 F (36.8 C) (Oral)   Resp 20   Wt 135 lb (61.2 kg)   SpO2 98%   BMI 21.14 kg/m  VS noted, not ill appearing Constitutional: Pt is oriented to person, place, and time. Appears well-developed and well-nourished, in no significant  distress Head: Normocephalic and atraumatic  Eyes: Conjunctivae and EOM are normal. Pupils are equal, round, and reactive to light Right Ear: External ear normal.  Left Ear: External ear normal  Left canal wax impaction resolved with irrigation Nose: Nose normal.  Mouth/Throat: Oropharynx is clear and moist  Neck: Normal range of motion. Neck supple. No JVD present. No tracheal deviation present or significant neck LA or mass Cardiovascular: Normal rate, regular rhythm, normal heart sounds and intact distal pulses.  with gr 2/6 systolic murmur Pulmonary/Chest: Effort normal and breath sounds without rales or wheezing  Abdominal: Soft. Bowel sounds are normal. NT. No HSM  Musculoskeletal: Normal range of motion. Exhibits no edema Lymphadenopathy: Has no cervical adenopathy.  Neurological: Pt is alert and oriented to person, place, and time. Pt has normal reflexes. No cranial nerve deficit. Motor grossly intact Skin: Skin is warm and dry. No rash noted or new ulcers Psychiatric:  Has normal mood and affect. Behavior is normal.  No other new exam findings  Transthoracic Echocardiography Patient:    Armon, Orvis MR #:       [id number] Study Date: 11/27/2015 Study Conclusions - Left ventricle: The cavity size was normal. There was mild   concentric hypertrophy. Systolic function was normal. The   estimated ejection fraction was in the range of 60% to 65%. Wall   motion was normal; there were no regional wall motion   abnormalities. Doppler parameters are consistent with abnormal   left ventricular relaxation (grade 1 diastolic dysfunction). - Aortic valve: Trileaflet; moderately thickened, moderately   calcified leaflets. There was very mild stenosis. Peak velocity   (S): 235 cm/s. Mean gradient (S): 10 mm Hg. - Mitral valve: Calcified annulus.     Assessment & Plan:

## 2016-11-04 NOTE — Assessment & Plan Note (Signed)
Mild, asympt,  to f/u any worsening symptoms or concerns, cont same tx

## 2016-11-04 NOTE — Assessment & Plan Note (Signed)
stable overall by history and exam, recent data reviewed with pt, and pt to continue medical treatment as before,  to f/u any worsening symptoms or concerns BP Readings from Last 3 Encounters:  11/04/16 130/70  11/01/15 120/78  05/01/15 122/78

## 2016-11-04 NOTE — Patient Instructions (Addendum)
Your left ear was irrigated today  Please continue all other medications as before, and refills have been done if requested.  Please have the pharmacy call with any other refills you may need.  Please continue your efforts at being more active, low cholesterol diet, and weight control.  You are otherwise up to date with prevention measures today.  Please keep your appointments with your specialists as you may have planned  You will be contacted regarding the referral for: cologuard  Please go to the LAB in the Basement (turn left off the elevator) for the tests to be done today  You will be contacted by phone if any changes need to be made immediately.  Otherwise, you will receive a letter about your results with an explanation, but please check with MyChart first.  Please remember to sign up for MyChart if you have not done so, as this will be important to you in the future with finding out test results, communicating by private email, and scheduling acute appointments online when needed.  Please return in 6 months, or sooner if needed

## 2016-11-04 NOTE — Assessment & Plan Note (Signed)
Improved with irrigation,  to f/u any worsening symptoms or concerns

## 2016-11-04 NOTE — Assessment & Plan Note (Signed)
stable overall by history and exam, recent data reviewed with pt, and pt to continue medical treatment as before,  to f/u any worsening symptoms or concerns Lab Results  Component Value Date   HGBA1C 7.6 (H) 11/01/2015   For fu labs

## 2016-12-08 ENCOUNTER — Telehealth: Payer: Self-pay | Admitting: Internal Medicine

## 2016-12-08 NOTE — Telephone Encounter (Signed)
Spoke with patient regarding awv. Patient stated that he is not interested in scheduling an awv at this time.

## 2017-01-05 ENCOUNTER — Ambulatory Visit (INDEPENDENT_AMBULATORY_CARE_PROVIDER_SITE_OTHER): Payer: Medicare Other | Admitting: Nurse Practitioner

## 2017-01-05 ENCOUNTER — Encounter: Payer: Self-pay | Admitting: Nurse Practitioner

## 2017-01-05 ENCOUNTER — Other Ambulatory Visit: Payer: Self-pay | Admitting: Nurse Practitioner

## 2017-01-05 ENCOUNTER — Telehealth: Payer: Self-pay | Admitting: Internal Medicine

## 2017-01-05 VITALS — BP 140/90 | HR 91 | Temp 98.1°F | Ht 67.0 in | Wt 135.0 lb

## 2017-01-05 DIAGNOSIS — J069 Acute upper respiratory infection, unspecified: Secondary | ICD-10-CM

## 2017-01-05 MED ORDER — PROMETHAZINE-DM 6.25-15 MG/5ML PO SYRP: 5.0000 mL | ORAL_SOLUTION | Freq: Three times a day (TID) | ORAL | 0 refills | Status: DC | PRN

## 2017-01-05 MED ORDER — DM-GUAIFENESIN ER 30-600 MG PO TB12: 1.0000 | ORAL_TABLET | Freq: Two times a day (BID) | ORAL | 0 refills | Status: DC | PRN

## 2017-01-05 MED ORDER — AZITHROMYCIN 250 MG PO TABS: 250.0000 mg | ORAL_TABLET | Freq: Every day | ORAL | 0 refills | Status: DC

## 2017-01-05 MED ORDER — FLUTICASONE PROPIONATE 50 MCG/ACT NA SUSP: 2.0000 | Freq: Every day | NASAL | 0 refills | Status: DC

## 2017-01-05 NOTE — Progress Notes (Signed)
Pre visit review using our clinic review tool, if applicable. No additional management support is needed unless otherwise documented below in the visit note. 

## 2017-01-05 NOTE — Telephone Encounter (Signed)
Pt is checking on the cologuard gued that suppose to be order and send to his house. Please check and call him back. Pt stated he spoke with Dr. [name] last ov.

## 2017-01-05 NOTE — Progress Notes (Signed)
Subjective:  Patient ID: Jonathan Miranda, male    DOB: January 08, 1951  Age: 66 y.o. MRN: [id number]  CC: Cough (cold (10 days ago)coughing going 4 days. took OTC)   Cough  This is a new problem. The current episode started in the past 7 days. The problem has been gradually worsening. The problem occurs constantly. The cough is productive of sputum. Associated symptoms include ear congestion, nasal congestion, postnasal drip, rhinorrhea and a sore throat. Pertinent negatives include no chest pain, chills, fever, headaches, shortness of breath or wheezing. The symptoms are aggravated by lying down. He has tried OTC cough suppressant, cool air and body position changes for the symptoms. The treatment provided no relief.    Outpatient Medications Prior to Visit  Medication Sig Dispense Refill  . aspirin EC 81 MG tablet Take 1 tablet (81 mg total) by mouth daily. 90 tablet 11  . cyclobenzaprine (FLEXERIL) 10 MG tablet TAKE 1 TABLET BY MOUTH THREE TIMES DAILY AS NEEDED FOR MUSCLE SPASMS 90 tablet 0  . diclofenac (VOLTAREN) 75 MG EC tablet TAKE 1 TABLET BY MOUTH TWICE DAILY 180 tablet 0  . lisinopril (PRINIVIL,ZESTRIL) 20 MG tablet TAKE 1 TABLET BY MOUTH DAILY 90 tablet 0  . metFORMIN (GLUCOPHAGE-XR) 500 MG 24 hr tablet Take 1 tablet (500 mg total) by mouth daily with breakfast. 90 tablet 3  . hydrOXYzine (ATARAX/VISTARIL) 25 MG tablet Take 1 tablet (25 mg total) by mouth every 6 (six) hours as needed for itching. (Patient not taking: Reported on 01/05/2017) 30 tablet 3   No facility-administered medications prior to visit.     ROS See HPI  Objective:  BP 140/90   Pulse 91   Temp 98.1 F (36.7 C)   Ht '5\' 7"'$  (1.702 m)   Wt 135 lb (61.2 kg)   SpO2 98%   BMI 21.14 kg/m   BP Readings from Last 3 Encounters:  01/05/17 140/90  11/04/16 130/70  11/01/15 120/78    Wt Readings from Last 3 Encounters:  01/05/17 135 lb (61.2 kg)  11/04/16 135 lb (61.2 kg)  11/01/15 132 lb (59.9 kg)     Physical Exam  Constitutional: He is oriented to person, place, and time. No distress.  HENT:  Right Ear: Tympanic membrane, external ear and ear canal normal.  Left Ear: Tympanic membrane and ear canal normal.  Nose: Mucosal edema and rhinorrhea present. Right sinus exhibits maxillary sinus tenderness and frontal sinus tenderness. Left sinus exhibits maxillary sinus tenderness and frontal sinus tenderness.  Mouth/Throat: Uvula is midline. Posterior oropharyngeal erythema present. No oropharyngeal exudate.  Eyes: No scleral icterus.  Neck: Normal range of motion. Neck supple.  Cardiovascular: Normal rate and regular rhythm.   Pulmonary/Chest: Effort normal and breath sounds normal.  Lymphadenopathy:    He has no cervical adenopathy.  Neurological: He is alert and oriented to person, place, and time.  Vitals reviewed.   Lab Results  Component Value Date   WBC 10.6 (H) 11/04/2016   HGB 16.7 11/04/2016   HCT 47.2 11/04/2016   PLT 354.0 11/04/2016   GLUCOSE 123 (H) 11/04/2016   CHOL 271 (H) 11/04/2016   TRIG 391.0 (H) 11/04/2016   HDL 34.40 (L) 11/04/2016   LDLDIRECT 180.0 11/04/2016   LDLCALC 142 (H) 11/01/2015   ALT 22 11/04/2016   AST 16 11/04/2016   NA 131 (L) 11/04/2016   K 4.4 11/04/2016   CL 95 (L) 11/04/2016   CREATININE 1.20 11/04/2016   BUN 16 11/04/2016   CO2  32 11/04/2016   TSH 3.01 11/04/2016   PSA 1.19 11/04/2016   HGBA1C 7.6 (H) 11/04/2016   MICROALBUR 5.0 (H) 11/04/2016    Mr Cervical Spine W Wo Contrast  Result Date: 08/13/2011 The examination had to be discontinued prior to completion due to the claustrophobia.  The patient could not tolerate any imaging due to claustrophobia.  No images were obtained.  There will be no charge for the MRI. BUN and creatinine were obtained on site at [facility] at [address] Results:  BUN 9.0 mg/dL,  Creatinine 0.9 mg/dL. Original Report Authenticated By: Resa Miner. MATTERN, M.D.   Assessment &  Plan:   Jonathan Miranda was seen today for cough.  Diagnoses and all orders for this visit:  Acute URI -     azithromycin (ZITHROMAX Z-PAK) 250 MG tablet; Take 1 tablet (250 mg total) by mouth daily. Take 2tabs on first day, then 1tab once a day till complete -     dextromethorphan-guaiFENesin (MUCINEX DM) 30-600 MG 12hr tablet; Take 1 tablet by mouth 2 (two) times daily as needed for cough. -     fluticasone (FLONASE) 50 MCG/ACT nasal spray; Place 2 sprays into both nostrils daily. -     promethazine-dextromethorphan (PROMETHAZINE-DM) 6.25-15 MG/5ML syrup; Take 5 mLs by mouth 3 (three) times daily as needed for cough.   I am having Jonathan Miranda start on azithromycin, dextromethorphan-guaiFENesin, fluticasone, and promethazine-dextromethorphan. I am also having him maintain his cyclobenzaprine, diclofenac, lisinopril, metFORMIN, aspirin EC, and hydrOXYzine.  Meds ordered this encounter  Medications  . hydrOXYzine (ATARAX/VISTARIL) 10 MG tablet    Sig: Take 10 mg by mouth 1 day or 1 dose.    Refill:  1  . azithromycin (ZITHROMAX Z-PAK) 250 MG tablet    Sig: Take 1 tablet (250 mg total) by mouth daily. Take 2tabs on first day, then 1tab once a day till complete    Dispense:  6 tablet    Refill:  0    Order Specific Question:   Supervising Provider    Answer:   Cassandria Anger [[id number]]  . dextromethorphan-guaiFENesin (MUCINEX DM) 30-600 MG 12hr tablet    Sig: Take 1 tablet by mouth 2 (two) times daily as needed for cough.    Dispense:  14 tablet    Refill:  0    Order Specific Question:   Supervising Provider    Answer:   Cassandria Anger [[id number]]  . fluticasone (FLONASE) 50 MCG/ACT nasal spray    Sig: Place 2 sprays into both nostrils daily.    Dispense:  16 g    Refill:  0    Order Specific Question:   Supervising Provider    Answer:   Cassandria Anger [[id number]]  . promethazine-dextromethorphan (PROMETHAZINE-DM) 6.25-15 MG/5ML syrup    Sig: Take 5 mLs by mouth 3 (three) times  daily as needed for cough.    Dispense:  240 mL    Refill:  0    Order Specific Question:   Supervising Provider    Answer:   Cassandria Anger [[id number]]    Follow-up: Return if symptoms worsen or fail to improve.  Wilfred Lacy, NP

## 2017-01-05 NOTE — Patient Instructions (Signed)
encourage adequate oral hydration

## 2017-01-06 NOTE — Telephone Encounter (Signed)
Ok. I will order it today. Can you email me the Colog[employer]ard webiste. I cant find the email with it in there and what I see online is that the site that I need to log in. Thanks!

## 2017-01-06 NOTE — Telephone Encounter (Signed)
Jonelle Sidle please help, I dont have or not sure the process to sign up.

## 2017-01-07 ENCOUNTER — Other Ambulatory Visit: Payer: Self-pay | Admitting: Internal Medicine

## 2017-01-25 ENCOUNTER — Other Ambulatory Visit: Payer: Self-pay | Admitting: Internal Medicine

## 2017-03-15 DIAGNOSIS — L309 Dermatitis, unspecified: Secondary | ICD-10-CM | POA: Diagnosis not present

## 2017-03-15 DIAGNOSIS — Z85828 Personal history of other malignant neoplasm of skin: Secondary | ICD-10-CM | POA: Diagnosis not present

## 2017-03-15 DIAGNOSIS — L814 Other melanin hyperpigmentation: Secondary | ICD-10-CM | POA: Diagnosis not present

## 2017-03-15 DIAGNOSIS — D225 Melanocytic nevi of trunk: Secondary | ICD-10-CM | POA: Diagnosis not present

## 2017-03-15 DIAGNOSIS — L821 Other seborrheic keratosis: Secondary | ICD-10-CM | POA: Diagnosis not present

## 2017-03-16 ENCOUNTER — Other Ambulatory Visit: Payer: Self-pay | Admitting: Internal Medicine

## 2017-04-07 ENCOUNTER — Other Ambulatory Visit: Payer: Self-pay | Admitting: Internal Medicine

## 2017-05-04 ENCOUNTER — Other Ambulatory Visit (INDEPENDENT_AMBULATORY_CARE_PROVIDER_SITE_OTHER): Payer: Medicare Other

## 2017-05-04 ENCOUNTER — Ambulatory Visit (INDEPENDENT_AMBULATORY_CARE_PROVIDER_SITE_OTHER): Payer: Medicare Other | Admitting: Internal Medicine

## 2017-05-04 ENCOUNTER — Encounter: Payer: Self-pay | Admitting: Internal Medicine

## 2017-05-04 VITALS — BP 136/88 | HR 85 | Ht 67.0 in | Wt 136.0 lb

## 2017-05-04 DIAGNOSIS — I10 Essential (primary) hypertension: Secondary | ICD-10-CM | POA: Diagnosis not present

## 2017-05-04 DIAGNOSIS — H918X2 Other specified hearing loss, left ear: Secondary | ICD-10-CM

## 2017-05-04 DIAGNOSIS — E782 Mixed hyperlipidemia: Secondary | ICD-10-CM | POA: Diagnosis not present

## 2017-05-04 DIAGNOSIS — E119 Type 2 diabetes mellitus without complications: Secondary | ICD-10-CM

## 2017-05-04 DIAGNOSIS — R7989 Other specified abnormal findings of blood chemistry: Secondary | ICD-10-CM | POA: Diagnosis not present

## 2017-05-04 DIAGNOSIS — R310 Gross hematuria: Secondary | ICD-10-CM

## 2017-05-04 LAB — HEPATIC FUNCTION PANEL
ALBUMIN: 4.4 g/dL (ref 3.5–5.2)
ALT: 17 U/L (ref 0–53)
AST: 15 U/L (ref 0–37)
Alkaline Phosphatase: 54 U/L (ref 39–117)
BILIRUBIN TOTAL: 0.5 mg/dL (ref 0.2–1.2)
Bilirubin, Direct: 0.1 mg/dL (ref 0.0–0.3)
Total Protein: 6.7 g/dL (ref 6.0–8.3)

## 2017-05-04 LAB — LIPID PANEL
CHOL/HDL RATIO: 6
CHOLESTEROL: 189 mg/dL (ref 0–200)
HDL: 34 mg/dL — AB (ref >39.00)
NonHDL: 154.8
Triglycerides: 274 mg/dL — ABNORMAL HIGH (ref 0.0–149.0)
VLDL: 54.8 mg/dL — AB (ref 0.0–40.0)

## 2017-05-04 LAB — URINALYSIS, ROUTINE W REFLEX MICROSCOPIC
Bilirubin Urine: NEGATIVE
Hgb urine dipstick: NEGATIVE
Ketones, ur: NEGATIVE
Leukocytes, UA: NEGATIVE
Nitrite: NEGATIVE
PH: 7 (ref 5.0–8.0)
RBC / HPF: NONE SEEN (ref 0–?)
Total Protein, Urine: NEGATIVE
Urine Glucose: NEGATIVE
Urobilinogen, UA: 0.2 (ref 0.0–1.0)

## 2017-05-04 LAB — BASIC METABOLIC PANEL
BUN: 8 mg/dL (ref 6–23)
CALCIUM: 9.4 mg/dL (ref 8.4–10.5)
CO2: 29 mEq/L (ref 19–32)
Chloride: 95 mEq/L — ABNORMAL LOW (ref 96–112)
Creatinine, Ser: 0.99 mg/dL (ref 0.40–1.50)
GFR: 79.94 mL/min (ref >60.00)
GLUCOSE: 169 mg/dL — AB (ref 70–99)
Potassium: 4.2 mEq/L (ref 3.5–5.1)
Sodium: 131 mEq/L — ABNORMAL LOW (ref 135–145)

## 2017-05-04 LAB — HEMOGLOBIN A1C: Hgb A1c MFr Bld: 7.9 % — ABNORMAL HIGH (ref 4.6–6.5)

## 2017-05-04 MED ORDER — DICLOFENAC SODIUM 1 % TD GEL: 4.0000 g | Freq: Four times a day (QID) | TRANSDERMAL | 1 refills | Status: DC | PRN

## 2017-05-04 MED ORDER — EZETIMIBE 10 MG PO TABS: 10.0000 mg | ORAL_TABLET | Freq: Every day | ORAL | 3 refills | Status: AC

## 2017-05-04 NOTE — Assessment & Plan Note (Signed)
stable overall by history and exam, recent data reviewed with pt, and pt to continue medical treatment as before,  to f/u any worsening symptoms or concerns\ Lab Results  Component Value Date   HGBA1C 7.6 (H) 11/04/2016  for f/u lab todya

## 2017-05-04 NOTE — Assessment & Plan Note (Signed)
stable overall by history and exam, recent data reviewed with pt, and pt to continue medical treatment as before,  to f/u any worsening symptoms or concerns BP Readings from Last 3 Encounters:  05/04/17 136/88  01/05/17 140/90  11/04/16 130/70

## 2017-05-04 NOTE — Assessment & Plan Note (Signed)
Ok to start zetia 10 qd,  to f/u any worsening symptoms or concerns

## 2017-05-04 NOTE — Assessment & Plan Note (Signed)
?   Exercise vs stone vs other - for UA with labs, and refer urology

## 2017-05-04 NOTE — Patient Instructions (Signed)
Your lewft ear was irrigated of wax impaction today  Please take all new medication as prescribed - the zetia for cholesterol,, and the Voltaren gel for arthritis  You will be contacted regarding the referral for: Urology  Please continue all other medications as before, and refills have been done if requested.  Please have the pharmacy call with any other refills you may need.  Please continue your efforts at being more active, low cholesterol diabetic diet, and weight control  Please keep your appointments with your specialists as you may have planned  Please go to the LAB in the Basement (turn left off the elevator) for the tests to be done today  You will be contacted by phone if any changes need to be made immediately.  Otherwise, you will receive a letter about your results with an explanation, but please check with MyChart first.  If you have Medicare related insurance (such as traditional Medicare, [employer] or [employer], or similar), Please make an appointment at the Scheduling desk with Sharee Pimple, the [employer], for your Wellness Visit in this office, which is a benefit with your insurance.  Please return in 6 months, or sooner if needed

## 2017-05-04 NOTE — Progress Notes (Signed)
Subjective:    Patient ID: Jonathan Miranda, male    DOB: Jun 16, 1951, 66 y.o.   MRN: [id number]  HPI   Here to f/u; overall doing ok,  Pt denies chest pain, increasing sob or doe, wheezing, orthopnea, PND, increased LE swelling, palpitations, dizziness or syncope.  Pt denies new neurological symptoms such as new headache, or facial or extremity weakness or numbness.  Pt denies polydipsia, polyuria, or low sugar episode.  Pt states overall good compliance with meds, mostly trying to follow appropriate diet, with wt overall stable, is willing to try zetia today though has not been able to take statins.  Good compliacne with metformin, and does make his appetite less.  Has seen NP recently with infectious symptoms resolved  Still walks 3 miles at the gym for 3 days per wk.  Also feels he has passed a kidney stone.  Has also had a couple of episodes of blood in the urine after exercise.  Denies urinary symptoms such as dysuria, frequency, urgency, flank pain, or n/v, fever, chills. Wondering if he needs doxy course as he has had before for prostatitis.  Hands ache with arthritic pain recently without swelling, overall mild, worse with damp weather. .  Has seen Dr Katrine Coho in 2012 with cysto for blood.   Past Medical History:  Diagnosis Date  . Allergic rhinitis   . Aortic stenosis, mild 11/28/2015  . Back pain   . Hep A w/o coma   . Hyperlipidemia   . Hypertension   . Nephrolithiasis   . Personal history of colonic polyps   . Prostatitis   . Rash   . Tinnitus    Past Surgical History:  Procedure Laterality Date  . correction deviated septum    . ORIF wrist- '05    . ureteroscopyp      reports that he has never smoked. He has never used smokeless tobacco. He reports that he drinks alcohol. He reports that he does not use drugs. family history includes Diabetes in his father; Heart disease in his mother; Hyperlipidemia in his mother; Hypertension in his mother. Allergies  Allergen  Reactions  . Iodine     REACTION: Hives  . Penicillins     REACTION: Hives  . Statins    Current Outpatient Prescriptions on File Prior to Visit  Medication Sig Dispense Refill  . aspirin EC 81 MG tablet Take 1 tablet (81 mg total) by mouth daily. 90 tablet 11  . azithromycin (ZITHROMAX Z-PAK) 250 MG tablet Take 1 tablet (250 mg total) by mouth daily. Take 2tabs on first day, then 1tab once a day till complete 6 tablet 0  . cyclobenzaprine (FLEXERIL) 10 MG tablet TAKE 1 TABLET BY MOUTH THREE TIMES DAILY AS NEEDED FOR MUSCLE SPASMS 90 tablet 1  . dextromethorphan-guaiFENesin (MUCINEX DM) 30-600 MG 12hr tablet Take 1 tablet by mouth 2 (two) times daily as needed for cough. 14 tablet 0  . diclofenac (VOLTAREN) 75 MG EC tablet TAKE 1 TABLET BY MOUTH TWICE DAILY 180 tablet 0  . doxycycline (VIBRA-TABS) 100 MG tablet TAKE 1 TABLET BY MOUTH TWICE DAILY AS NEEDED FOR RECURRENT PROSTATITIS 20 tablet 0  . fluticasone (FLONASE) 50 MCG/ACT nasal spray Place 2 sprays into both nostrils daily. 16 g 0  . hydrOXYzine (ATARAX/VISTARIL) 10 MG tablet Take 10 mg by mouth 1 day or 1 dose.  1  . lisinopril (PRINIVIL,ZESTRIL) 20 MG tablet TAKE 1 TABLET BY MOUTH DAILY 90 tablet 2  . metFORMIN (GLUCOPHAGE-XR)  500 MG 24 hr tablet Take 1 tablet (500 mg total) by mouth daily with breakfast. 90 tablet 3  . promethazine-dextromethorphan (PROMETHAZINE-DM) 6.25-15 MG/5ML syrup Take 5 mLs by mouth 3 (three) times daily as needed for cough. 240 mL 0   No current facility-administered medications on file prior to visit.    Review of Systems  Constitutional: Negative for other unusual diaphoresis or sweats HENT: Negative for ear discharge or swelling Eyes: Negative for other worsening visual disturbances Respiratory: Negative for stridor or other swelling  Gastrointestinal: Negative for worsening distension or other blood Genitourinary: Negative for retention or other urinary change Musculoskeletal: Negative for other MSK  pain or swelling Skin: Negative for color change or other new lesions Neurological: Negative for worsening tremors and other numbness  Psychiatric/Behavioral: Negative for worsening agitation or other fatigue All other system neg per pt    Objective:   Physical Exam BP 136/88   Pulse 85   Ht 5\' 7"  (1.702 m)   Wt 136 lb (61.7 kg)   SpO2 97%   BMI 21.30 kg/m  VS noted,  Constitutional: Pt appears in NAD HENT: Head: NCAT.  Right Ear: External ear normal.  Left Ear: External ear normal.  Left canal with wax impaction resolved with irrigation Eyes: . Pupils are equal, round, and reactive to light. Conjunctivae and EOM are normal Nose: without d/c or deformity Neck: Neck supple. Gross normal ROM Cardiovascular: Normal rate and regular rhythm.   Pulmonary/Chest: Effort normal and breath sounds without rales or wheezing.  Abd:  Soft, NT, ND, + BS, no organomegaly Neurological: Pt is alert. At baseline orientation, motor grossly intact Skin: Skin is warm. No rashes, other new lesions, no LE edema Psychiatric: Pt behavior is normal without agitation  Hand OA changes noted Lab Results  Component Value Date   WBC 10.6 (H) 11/04/2016   HGB 16.7 11/04/2016   HCT 47.2 11/04/2016   PLT 354.0 11/04/2016   GLUCOSE 123 (H) 11/04/2016   CHOL 271 (H) 11/04/2016   TRIG 391.0 (H) 11/04/2016   HDL 34.40 (L) 11/04/2016   LDLDIRECT 180.0 11/04/2016   LDLCALC 142 (H) 11/01/2015   ALT 22 11/04/2016   AST 16 11/04/2016   NA 131 (L) 11/04/2016   K 4.4 11/04/2016   CL 95 (L) 11/04/2016   CREATININE 1.20 11/04/2016   BUN 16 11/04/2016   CO2 32 11/04/2016   TSH 3.01 11/04/2016   PSA 1.19 11/04/2016   HGBA1C 7.6 (H) 11/04/2016   MICROALBUR 5.0 (H) 11/04/2016       Assessment & Plan:

## 2017-05-04 NOTE — Assessment & Plan Note (Signed)
Also for left ear irrigation today with hearing improved

## 2017-05-05 ENCOUNTER — Other Ambulatory Visit: Payer: Self-pay | Admitting: Internal Medicine

## 2017-05-05 ENCOUNTER — Telehealth: Payer: Self-pay

## 2017-05-05 LAB — URINE CULTURE: ORGANISM ID, BACTERIA: NO GROWTH

## 2017-05-05 LAB — LDL CHOLESTEROL, DIRECT: Direct LDL: 121 mg/dL

## 2017-05-05 MED ORDER — METFORMIN HCL ER 500 MG PO TB24: 1000.0000 mg | ORAL_TABLET | Freq: Every day | ORAL | 3 refills | Status: DC

## 2017-05-05 NOTE — Telephone Encounter (Signed)
-----   Message from Biagio Borg, MD sent at 05/05/2017 12:55 PM EDT ----- Left message on MyChart, pt to cont same tx except  The test results show that your current treatment is OK, except the A1c is too high, with the goal being to be less than 7.  We should increase the metformin to 2 pills per day.  A new prescription will be sent, and you should be called from the office   Jonathan Miranda to please inform pt, I will do rx

## 2017-05-05 NOTE — Telephone Encounter (Signed)
Called pt, LVM.   

## 2017-05-06 ENCOUNTER — Telehealth: Payer: Self-pay | Admitting: Internal Medicine

## 2017-05-06 NOTE — Telephone Encounter (Signed)
Ok to stay off the zetia due to cost.   We will ask shirron to handle the PA for voltaren

## 2017-05-06 NOTE — Telephone Encounter (Signed)
Patient states Zetia has a 300 copay with his insurance and he can not afford this medication.  Also states that this was prescribed before his cholesterol results came back.  States he would like this to be looked at again.  Please follow up in regard.   Also states voltaren gel requires a PA and it has been sent over by the pharmacy.

## 2017-05-06 NOTE — Telephone Encounter (Signed)
Patient calling back in regard to results.  Requesting call back.  Will add note about other issues.

## 2017-05-06 NOTE — Telephone Encounter (Signed)
Ok to take the 2 in the AM since these are 24 hr pills and can be taken together; sorry for the confusion

## 2017-05-06 NOTE — Telephone Encounter (Signed)
Patient states that Dr. [name] has on mychart to take metformin once in morning and once at night.  Patient states script states 2 at breakfast.  Would like clarification.

## 2017-05-06 NOTE — Telephone Encounter (Signed)
Called pt, LVM to return call to discuss medication frequency for metformin.

## 2017-05-07 NOTE — Telephone Encounter (Signed)
Pt has been informed and expressed understanding.  

## 2017-05-12 ENCOUNTER — Telehealth: Payer: Self-pay

## 2017-05-12 NOTE — Telephone Encounter (Signed)
Approved  Through 10/04/2017 Key [id number]

## 2017-07-12 DIAGNOSIS — N5201 Erectile dysfunction due to arterial insufficiency: Secondary | ICD-10-CM | POA: Diagnosis not present

## 2017-07-12 DIAGNOSIS — R310 Gross hematuria: Secondary | ICD-10-CM | POA: Diagnosis not present

## 2017-07-19 ENCOUNTER — Other Ambulatory Visit: Payer: Self-pay | Admitting: Internal Medicine

## 2017-07-20 DIAGNOSIS — Z23 Encounter for immunization: Secondary | ICD-10-CM | POA: Diagnosis not present

## 2017-09-19 ENCOUNTER — Other Ambulatory Visit: Payer: Self-pay | Admitting: Internal Medicine

## 2017-09-20 NOTE — Telephone Encounter (Signed)
I am not sure doxycycline is appropriate and is not normally a refillable med  Please consider OV if felt needed

## 2017-10-09 ENCOUNTER — Other Ambulatory Visit: Payer: Self-pay | Admitting: Internal Medicine

## 2017-11-04 ENCOUNTER — Ambulatory Visit: Payer: Medicare Other | Admitting: Internal Medicine

## 2017-11-08 DIAGNOSIS — H35033 Hypertensive retinopathy, bilateral: Secondary | ICD-10-CM | POA: Diagnosis not present

## 2017-11-08 DIAGNOSIS — E119 Type 2 diabetes mellitus without complications: Secondary | ICD-10-CM | POA: Diagnosis not present

## 2017-11-08 DIAGNOSIS — H2513 Age-related nuclear cataract, bilateral: Secondary | ICD-10-CM | POA: Diagnosis not present

## 2017-11-23 DIAGNOSIS — M62830 Muscle spasm of back: Secondary | ICD-10-CM | POA: Diagnosis not present

## 2017-11-23 DIAGNOSIS — Z1389 Encounter for screening for other disorder: Secondary | ICD-10-CM | POA: Diagnosis not present

## 2017-11-23 DIAGNOSIS — Z681 Body mass index (BMI) 19 or less, adult: Secondary | ICD-10-CM | POA: Diagnosis not present

## 2017-11-23 DIAGNOSIS — R3121 Asymptomatic microscopic hematuria: Secondary | ICD-10-CM | POA: Diagnosis not present

## 2017-11-23 DIAGNOSIS — L500 Allergic urticaria: Secondary | ICD-10-CM | POA: Diagnosis not present

## 2017-11-23 DIAGNOSIS — E119 Type 2 diabetes mellitus without complications: Secondary | ICD-10-CM | POA: Diagnosis not present

## 2017-11-23 DIAGNOSIS — I10 Essential (primary) hypertension: Secondary | ICD-10-CM | POA: Diagnosis not present

## 2017-11-23 DIAGNOSIS — H268 Other specified cataract: Secondary | ICD-10-CM | POA: Diagnosis not present

## 2017-12-06 DIAGNOSIS — H2511 Age-related nuclear cataract, right eye: Secondary | ICD-10-CM | POA: Diagnosis not present

## 2017-12-14 DIAGNOSIS — H2511 Age-related nuclear cataract, right eye: Secondary | ICD-10-CM | POA: Diagnosis not present

## 2017-12-14 DIAGNOSIS — H25811 Combined forms of age-related cataract, right eye: Secondary | ICD-10-CM | POA: Diagnosis not present

## 2017-12-14 DIAGNOSIS — H2181 Floppy iris syndrome: Secondary | ICD-10-CM | POA: Diagnosis not present

## 2017-12-14 DIAGNOSIS — H5703 Miosis: Secondary | ICD-10-CM | POA: Diagnosis not present

## 2017-12-22 DIAGNOSIS — H2512 Age-related nuclear cataract, left eye: Secondary | ICD-10-CM | POA: Diagnosis not present

## 2018-01-04 DIAGNOSIS — H25812 Combined forms of age-related cataract, left eye: Secondary | ICD-10-CM | POA: Diagnosis not present

## 2018-01-04 DIAGNOSIS — H2181 Floppy iris syndrome: Secondary | ICD-10-CM | POA: Diagnosis not present

## 2018-01-04 DIAGNOSIS — H2512 Age-related nuclear cataract, left eye: Secondary | ICD-10-CM | POA: Diagnosis not present

## 2018-03-18 DIAGNOSIS — E119 Type 2 diabetes mellitus without complications: Secondary | ICD-10-CM | POA: Diagnosis not present

## 2018-03-18 DIAGNOSIS — Z79899 Other long term (current) drug therapy: Secondary | ICD-10-CM | POA: Diagnosis not present

## 2018-03-18 DIAGNOSIS — Z125 Encounter for screening for malignant neoplasm of prostate: Secondary | ICD-10-CM | POA: Diagnosis not present

## 2018-03-21 DIAGNOSIS — E7849 Other hyperlipidemia: Secondary | ICD-10-CM | POA: Diagnosis not present

## 2018-03-25 DIAGNOSIS — E1169 Type 2 diabetes mellitus with other specified complication: Secondary | ICD-10-CM | POA: Diagnosis not present

## 2018-03-25 DIAGNOSIS — Z0000 Encounter for general adult medical examination without abnormal findings: Secondary | ICD-10-CM | POA: Diagnosis not present

## 2018-03-25 DIAGNOSIS — I444 Left anterior fascicular block: Secondary | ICD-10-CM | POA: Diagnosis not present

## 2018-03-25 DIAGNOSIS — Z681 Body mass index (BMI) 19 or less, adult: Secondary | ICD-10-CM | POA: Diagnosis not present

## 2018-03-25 DIAGNOSIS — D72829 Elevated white blood cell count, unspecified: Secondary | ICD-10-CM | POA: Diagnosis not present

## 2018-03-25 DIAGNOSIS — Z1389 Encounter for screening for other disorder: Secondary | ICD-10-CM | POA: Diagnosis not present

## 2018-03-25 DIAGNOSIS — I10 Essential (primary) hypertension: Secondary | ICD-10-CM | POA: Diagnosis not present

## 2018-03-25 DIAGNOSIS — M791 Myalgia, unspecified site: Secondary | ICD-10-CM | POA: Diagnosis not present

## 2018-03-25 DIAGNOSIS — Z23 Encounter for immunization: Secondary | ICD-10-CM | POA: Diagnosis not present

## 2018-03-25 DIAGNOSIS — E7849 Other hyperlipidemia: Secondary | ICD-10-CM | POA: Diagnosis not present

## 2018-04-28 DIAGNOSIS — D229 Melanocytic nevi, unspecified: Secondary | ICD-10-CM | POA: Diagnosis not present

## 2018-04-28 DIAGNOSIS — L570 Actinic keratosis: Secondary | ICD-10-CM | POA: Diagnosis not present

## 2018-04-28 DIAGNOSIS — L814 Other melanin hyperpigmentation: Secondary | ICD-10-CM | POA: Diagnosis not present

## 2018-04-28 DIAGNOSIS — D1801 Hemangioma of skin and subcutaneous tissue: Secondary | ICD-10-CM | POA: Diagnosis not present

## 2018-04-28 DIAGNOSIS — L578 Other skin changes due to chronic exposure to nonionizing radiation: Secondary | ICD-10-CM | POA: Diagnosis not present

## 2018-04-28 DIAGNOSIS — L821 Other seborrheic keratosis: Secondary | ICD-10-CM | POA: Diagnosis not present

## 2018-04-28 DIAGNOSIS — Z85828 Personal history of other malignant neoplasm of skin: Secondary | ICD-10-CM | POA: Diagnosis not present

## 2018-07-14 DIAGNOSIS — Z23 Encounter for immunization: Secondary | ICD-10-CM | POA: Diagnosis not present

## 2018-08-08 DIAGNOSIS — D72829 Elevated white blood cell count, unspecified: Secondary | ICD-10-CM | POA: Diagnosis not present

## 2018-08-08 DIAGNOSIS — E1169 Type 2 diabetes mellitus with other specified complication: Secondary | ICD-10-CM | POA: Diagnosis not present

## 2018-08-11 DIAGNOSIS — E1169 Type 2 diabetes mellitus with other specified complication: Secondary | ICD-10-CM | POA: Diagnosis not present

## 2018-08-11 DIAGNOSIS — Z681 Body mass index (BMI) 19 or less, adult: Secondary | ICD-10-CM | POA: Diagnosis not present

## 2018-08-11 DIAGNOSIS — Z87442 Personal history of urinary calculi: Secondary | ICD-10-CM | POA: Diagnosis not present

## 2018-08-11 DIAGNOSIS — I10 Essential (primary) hypertension: Secondary | ICD-10-CM | POA: Diagnosis not present

## 2018-08-11 DIAGNOSIS — E7849 Other hyperlipidemia: Secondary | ICD-10-CM | POA: Diagnosis not present

## 2018-08-24 DIAGNOSIS — Z961 Presence of intraocular lens: Secondary | ICD-10-CM | POA: Diagnosis not present

## 2018-08-24 DIAGNOSIS — H35033 Hypertensive retinopathy, bilateral: Secondary | ICD-10-CM | POA: Diagnosis not present

## 2018-08-24 DIAGNOSIS — E119 Type 2 diabetes mellitus without complications: Secondary | ICD-10-CM | POA: Diagnosis not present

## 2018-08-24 DIAGNOSIS — H353131 Nonexudative age-related macular degeneration, bilateral, early dry stage: Secondary | ICD-10-CM | POA: Diagnosis not present

## 2018-12-23 DIAGNOSIS — I10 Essential (primary) hypertension: Secondary | ICD-10-CM | POA: Diagnosis not present

## 2019-06-15 DIAGNOSIS — L821 Other seborrheic keratosis: Secondary | ICD-10-CM | POA: Diagnosis not present

## 2019-06-15 DIAGNOSIS — D229 Melanocytic nevi, unspecified: Secondary | ICD-10-CM | POA: Diagnosis not present

## 2019-06-15 DIAGNOSIS — L570 Actinic keratosis: Secondary | ICD-10-CM | POA: Diagnosis not present

## 2019-06-15 DIAGNOSIS — L42 Pityriasis rosea: Secondary | ICD-10-CM | POA: Diagnosis not present

## 2019-06-15 DIAGNOSIS — D1801 Hemangioma of skin and subcutaneous tissue: Secondary | ICD-10-CM | POA: Diagnosis not present

## 2020-01-19 ENCOUNTER — Other Ambulatory Visit: Payer: Self-pay | Admitting: Family Medicine

## 2020-01-19 DIAGNOSIS — N200 Calculus of kidney: Secondary | ICD-10-CM

## 2020-07-31 ENCOUNTER — Emergency Department (HOSPITAL_COMMUNITY): Payer: Medicare Other

## 2020-07-31 ENCOUNTER — Observation Stay (HOSPITAL_COMMUNITY): Payer: Medicare Other

## 2020-07-31 ENCOUNTER — Encounter (HOSPITAL_COMMUNITY): Payer: Self-pay

## 2020-07-31 ENCOUNTER — Inpatient Hospital Stay (HOSPITAL_COMMUNITY)
Admission: EM | Admit: 2020-07-31 | Discharge: 2020-08-06 | DRG: 064 | Disposition: A | Discharge status: Skilled Nursing Facility | Payer: Medicare Other | Attending: Internal Medicine | Admitting: Internal Medicine

## 2020-07-31 ENCOUNTER — Other Ambulatory Visit: Payer: Self-pay

## 2020-07-31 DIAGNOSIS — C679 Malignant neoplasm of bladder, unspecified: Secondary | ICD-10-CM | POA: Diagnosis present

## 2020-07-31 DIAGNOSIS — Z8249 Family history of ischemic heart disease and other diseases of the circulatory system: Secondary | ICD-10-CM

## 2020-07-31 DIAGNOSIS — I10 Essential (primary) hypertension: Secondary | ICD-10-CM | POA: Diagnosis present

## 2020-07-31 DIAGNOSIS — Z79899 Other long term (current) drug therapy: Secondary | ICD-10-CM

## 2020-07-31 DIAGNOSIS — Z8719 Personal history of other diseases of the digestive system: Secondary | ICD-10-CM

## 2020-07-31 DIAGNOSIS — W19XXXA Unspecified fall, initial encounter: Secondary | ICD-10-CM

## 2020-07-31 DIAGNOSIS — Z87442 Personal history of urinary calculi: Secondary | ICD-10-CM

## 2020-07-31 DIAGNOSIS — E119 Type 2 diabetes mellitus without complications: Secondary | ICD-10-CM

## 2020-07-31 DIAGNOSIS — R64 Cachexia: Secondary | ICD-10-CM | POA: Diagnosis present

## 2020-07-31 DIAGNOSIS — I313 Pericardial effusion (noninflammatory): Secondary | ICD-10-CM | POA: Diagnosis present

## 2020-07-31 DIAGNOSIS — J309 Allergic rhinitis, unspecified: Secondary | ICD-10-CM | POA: Diagnosis present

## 2020-07-31 DIAGNOSIS — Z888 Allergy status to other drugs, medicaments and biological substances status: Secondary | ICD-10-CM

## 2020-07-31 DIAGNOSIS — Z20822 Contact with and (suspected) exposure to covid-19: Secondary | ICD-10-CM | POA: Diagnosis present

## 2020-07-31 DIAGNOSIS — R4182 Altered mental status, unspecified: Secondary | ICD-10-CM

## 2020-07-31 DIAGNOSIS — Z515 Encounter for palliative care: Secondary | ICD-10-CM

## 2020-07-31 DIAGNOSIS — Z7984 Long term (current) use of oral hypoglycemic drugs: Secondary | ICD-10-CM

## 2020-07-31 DIAGNOSIS — E782 Mixed hyperlipidemia: Secondary | ICD-10-CM | POA: Diagnosis present

## 2020-07-31 DIAGNOSIS — C799 Secondary malignant neoplasm of unspecified site: Secondary | ICD-10-CM

## 2020-07-31 DIAGNOSIS — C61 Malignant neoplasm of prostate: Secondary | ICD-10-CM

## 2020-07-31 DIAGNOSIS — R590 Localized enlarged lymph nodes: Secondary | ICD-10-CM | POA: Diagnosis present

## 2020-07-31 DIAGNOSIS — C3490 Malignant neoplasm of unspecified part of unspecified bronchus or lung: Secondary | ICD-10-CM

## 2020-07-31 DIAGNOSIS — R29704 NIHSS score 4: Secondary | ICD-10-CM | POA: Diagnosis present

## 2020-07-31 DIAGNOSIS — I6340 Cerebral infarction due to embolism of unspecified cerebral artery: Principal | ICD-10-CM | POA: Diagnosis present

## 2020-07-31 DIAGNOSIS — Z66 Do not resuscitate: Secondary | ICD-10-CM

## 2020-07-31 DIAGNOSIS — Z7189 Other specified counseling: Secondary | ICD-10-CM

## 2020-07-31 DIAGNOSIS — Z7982 Long term (current) use of aspirin: Secondary | ICD-10-CM

## 2020-07-31 DIAGNOSIS — R29810 Facial weakness: Secondary | ICD-10-CM | POA: Diagnosis present

## 2020-07-31 DIAGNOSIS — I350 Nonrheumatic aortic (valve) stenosis: Secondary | ICD-10-CM | POA: Diagnosis present

## 2020-07-31 DIAGNOSIS — Z880 Allergy status to penicillin: Secondary | ICD-10-CM

## 2020-07-31 DIAGNOSIS — I2699 Other pulmonary embolism without acute cor pulmonale: Secondary | ICD-10-CM | POA: Diagnosis present

## 2020-07-31 DIAGNOSIS — Z681 Body mass index (BMI) 19 or less, adult: Secondary | ICD-10-CM

## 2020-07-31 DIAGNOSIS — I639 Cerebral infarction, unspecified: Secondary | ICD-10-CM

## 2020-07-31 DIAGNOSIS — I69351 Hemiplegia and hemiparesis following cerebral infarction affecting right dominant side: Secondary | ICD-10-CM

## 2020-07-31 DIAGNOSIS — Z833 Family history of diabetes mellitus: Secondary | ICD-10-CM

## 2020-07-31 DIAGNOSIS — R531 Weakness: Secondary | ICD-10-CM | POA: Diagnosis not present

## 2020-07-31 DIAGNOSIS — E441 Mild protein-calorie malnutrition: Secondary | ICD-10-CM | POA: Diagnosis present

## 2020-07-31 DIAGNOSIS — Z83438 Family history of other disorder of lipoprotein metabolism and other lipidemia: Secondary | ICD-10-CM

## 2020-07-31 DIAGNOSIS — Z7952 Long term (current) use of systemic steroids: Secondary | ICD-10-CM

## 2020-07-31 HISTORY — DX: Malignant (primary) neoplasm, unspecified: C80.1

## 2020-07-31 LAB — I-STAT CHEM 8, ED
BUN: 39 mg/dL — ABNORMAL HIGH (ref 8–23)
Calcium, Ion: 1.18 mmol/L (ref 1.15–1.40)
Chloride: 103 mmol/L (ref 98–111)
Creatinine, Ser: 1.2 mg/dL (ref 0.61–1.24)
Glucose, Bld: 235 mg/dL — ABNORMAL HIGH (ref 70–99)
HCT: 34 % — ABNORMAL LOW (ref 39.0–52.0)
Hemoglobin: 11.6 g/dL — ABNORMAL LOW (ref 13.0–17.0)
Potassium: 4.6 mmol/L (ref 3.5–5.1)
Sodium: 136 mmol/L (ref 135–145)
TCO2: 21 mmol/L — ABNORMAL LOW (ref 22–32)

## 2020-07-31 LAB — DIFFERENTIAL
Abs Immature Granulocytes: 0.21 10*3/uL — ABNORMAL HIGH (ref 0.00–0.07)
Basophils Absolute: 0.1 10*3/uL (ref 0.0–0.1)
Basophils Relative: 0 %
Eosinophils Absolute: 0 10*3/uL (ref 0.0–0.5)
Eosinophils Relative: 0 %
Immature Granulocytes: 1 %
Lymphocytes Relative: 9 %
Lymphs Abs: 2.1 10*3/uL (ref 0.7–4.0)
Monocytes Absolute: 2.7 10*3/uL — ABNORMAL HIGH (ref 0.1–1.0)
Monocytes Relative: 11 %
Neutro Abs: 18.5 10*3/uL — ABNORMAL HIGH (ref 1.7–7.7)
Neutrophils Relative %: 79 %

## 2020-07-31 LAB — GLUCOSE, CAPILLARY
Glucose-Capillary: 217 mg/dL — ABNORMAL HIGH (ref 70–99)
Glucose-Capillary: 147 mg/dL — ABNORMAL HIGH (ref 70–99)

## 2020-07-31 LAB — URINALYSIS, ROUTINE W REFLEX MICROSCOPIC
Bilirubin Urine: NEGATIVE
Glucose, UA: 50 mg/dL — AB
Ketones, ur: 20 mg/dL — AB
Leukocytes,Ua: NEGATIVE
Nitrite: NEGATIVE
Protein, ur: 30 mg/dL — AB
Specific Gravity, Urine: 1.034 — ABNORMAL HIGH (ref 1.005–1.030)
pH: 6 (ref 5.0–8.0)

## 2020-07-31 LAB — PROTIME-INR
INR: 1.5 — ABNORMAL HIGH (ref 0.8–1.2)
Prothrombin Time: 17.2 seconds — ABNORMAL HIGH (ref 11.4–15.2)

## 2020-07-31 LAB — RAPID URINE DRUG SCREEN, HOSP PERFORMED
Amphetamines: NOT DETECTED
Barbiturates: NOT DETECTED
Benzodiazepines: NOT DETECTED
Cocaine: NOT DETECTED
Opiates: NOT DETECTED
Tetrahydrocannabinol: POSITIVE — AB

## 2020-07-31 LAB — COMPREHENSIVE METABOLIC PANEL
ALT: 20 U/L (ref 0–44)
AST: 36 U/L (ref 15–41)
Albumin: 3.2 g/dL — ABNORMAL LOW (ref 3.5–5.0)
Alkaline Phosphatase: 176 U/L — ABNORMAL HIGH (ref 38–126)
Anion gap: 16 — ABNORMAL HIGH (ref 5–15)
BUN: 31 mg/dL — ABNORMAL HIGH (ref 8–23)
CO2: 19 mmol/L — ABNORMAL LOW (ref 22–32)
Calcium: 9.5 mg/dL (ref 8.9–10.3)
Chloride: 102 mmol/L (ref 98–111)
Creatinine, Ser: 1.38 mg/dL — ABNORMAL HIGH (ref 0.61–1.24)
GFR, Estimated: 55 mL/min — ABNORMAL LOW (ref >60)
Glucose, Bld: 235 mg/dL — ABNORMAL HIGH (ref 70–99)
Potassium: 4.4 mmol/L (ref 3.5–5.1)
Sodium: 137 mmol/L (ref 135–145)
Total Bilirubin: 1.5 mg/dL — ABNORMAL HIGH (ref 0.3–1.2)
Total Protein: 6 g/dL — ABNORMAL LOW (ref 6.5–8.1)

## 2020-07-31 LAB — CBC
HCT: 34.3 % — ABNORMAL LOW (ref 39.0–52.0)
Hemoglobin: 11.3 g/dL — ABNORMAL LOW (ref 13.0–17.0)
MCH: 28.8 pg (ref 26.0–34.0)
MCHC: 32.9 g/dL (ref 30.0–36.0)
MCV: 87.3 fL (ref 80.0–100.0)
Platelets: 294 10*3/uL (ref 150–400)
RBC: 3.93 MIL/uL — ABNORMAL LOW (ref 4.22–5.81)
RDW: 14.9 % (ref 11.5–15.5)
WBC: 23.6 10*3/uL — ABNORMAL HIGH (ref 4.0–10.5)
nRBC: 0 % (ref 0.0–0.2)

## 2020-07-31 LAB — RESPIRATORY PANEL BY RT PCR (FLU A&B, COVID)
Influenza A by PCR: NEGATIVE
Influenza B by PCR: NEGATIVE
SARS Coronavirus 2 by RT PCR: NEGATIVE

## 2020-07-31 LAB — CBG MONITORING, ED: Glucose-Capillary: 213 mg/dL — ABNORMAL HIGH (ref 70–99)

## 2020-07-31 LAB — APTT: aPTT: 27 seconds (ref 24–36)

## 2020-07-31 LAB — ETHANOL: Alcohol, Ethyl (B): <10 mg/dL (ref <10)

## 2020-07-31 MED ORDER — HEPARIN SODIUM (PORCINE) 5000 UNIT/ML IJ SOLN
5000.0000 [IU] | Freq: Three times a day (TID) | INTRAMUSCULAR | Status: DC
  Administered 2020-07-31 – 2020-08-01 (×2): 5000 [IU] via SUBCUTANEOUS
  Filled 2020-07-31 (×3): qty 1

## 2020-07-31 MED ORDER — INSULIN ASPART 100 UNIT/ML ~~LOC~~ SOLN
0.0000 [IU] | SUBCUTANEOUS | Status: DC
  Administered 2020-08-01: 1 [IU] via SUBCUTANEOUS
  Administered 2020-08-01 (×3): 3 [IU] via SUBCUTANEOUS
  Administered 2020-08-01: 1 [IU] via SUBCUTANEOUS
  Administered 2020-08-02: 3 [IU] via SUBCUTANEOUS
  Administered 2020-08-02: 2 [IU] via SUBCUTANEOUS
  Administered 2020-08-02: 9 [IU] via SUBCUTANEOUS
  Administered 2020-08-02: 1 [IU] via SUBCUTANEOUS
  Administered 2020-08-02: 2 [IU] via SUBCUTANEOUS
  Administered 2020-08-02: 3 [IU] via SUBCUTANEOUS
  Administered 2020-08-03: 5 [IU] via SUBCUTANEOUS
  Administered 2020-08-03: 3 [IU] via SUBCUTANEOUS
  Administered 2020-08-03 (×2): 2 [IU] via SUBCUTANEOUS
  Administered 2020-08-04: 3 [IU] via SUBCUTANEOUS
  Administered 2020-08-04 (×2): 2 [IU] via SUBCUTANEOUS
  Administered 2020-08-04 – 2020-08-05 (×2): 3 [IU] via SUBCUTANEOUS
  Administered 2020-08-05: 2 [IU] via SUBCUTANEOUS
  Administered 2020-08-05: 5 [IU] via SUBCUTANEOUS
  Administered 2020-08-05: 1 [IU] via SUBCUTANEOUS
  Administered 2020-08-06: 2 [IU] via SUBCUTANEOUS
  Administered 2020-08-06: 3 [IU] via SUBCUTANEOUS
  Administered 2020-08-06: 1 [IU] via SUBCUTANEOUS

## 2020-07-31 MED ORDER — ACETAMINOPHEN 650 MG RE SUPP: 650.0000 mg | RECTAL | Status: DC | PRN

## 2020-07-31 MED ORDER — IOHEXOL 350 MG/ML SOLN: 100.0000 mL | Freq: Once | INTRAVENOUS | Status: DC | PRN

## 2020-07-31 MED ORDER — STROKE: EARLY STAGES OF RECOVERY BOOK
Freq: Once | Status: AC
  Filled 2020-07-31: qty 1

## 2020-07-31 MED ORDER — THIAMINE HCL 100 MG/ML IJ SOLN
100.0000 mg | Freq: Every day | INTRAMUSCULAR | Status: AC
  Administered 2020-08-01 – 2020-08-02 (×2): 100 mg via INTRAVENOUS
  Filled 2020-07-31 (×2): qty 2

## 2020-07-31 MED ORDER — HYDROCODONE-ACETAMINOPHEN 5-325 MG PO TABS
1.0000 | ORAL_TABLET | Freq: Four times a day (QID) | ORAL | Status: DC | PRN
  Administered 2020-08-03 – 2020-08-06 (×3): 1 via ORAL
  Filled 2020-07-31 (×3): qty 1

## 2020-07-31 MED ORDER — DIAZEPAM 5 MG/ML IJ SOLN
2.5000 mg | Freq: Once | INTRAMUSCULAR | Status: AC
  Administered 2020-07-31: 2.5 mg via INTRAVENOUS
  Filled 2020-07-31: qty 2

## 2020-07-31 MED ORDER — DIAZEPAM 5 MG PO TABS
5.0000 mg | ORAL_TABLET | Freq: Once | ORAL | Status: DC
  Filled 2020-07-31: qty 1

## 2020-07-31 MED ORDER — ASPIRIN 300 MG RE SUPP
300.0000 mg | Freq: Every day | RECTAL | Status: DC
  Administered 2020-07-31: 300 mg via RECTAL
  Filled 2020-07-31: qty 1

## 2020-07-31 MED ORDER — ACETAMINOPHEN 325 MG PO TABS: 650.0000 mg | ORAL_TABLET | ORAL | Status: DC | PRN

## 2020-07-31 MED ORDER — EZETIMIBE 10 MG PO TABS
10.0000 mg | ORAL_TABLET | Freq: Every day | ORAL | Status: DC
  Administered 2020-08-01 – 2020-08-06 (×6): 10 mg via ORAL
  Filled 2020-07-31 (×7): qty 1

## 2020-07-31 MED ORDER — HYDROXYZINE HCL 10 MG PO TABS: 10.0000 mg | ORAL_TABLET | ORAL | Status: DC

## 2020-07-31 MED ORDER — LORAZEPAM 2 MG/ML IJ SOLN
1.0000 mg | Freq: Once | INTRAMUSCULAR | Status: AC
  Administered 2020-07-31: 1 mg via INTRAVENOUS
  Filled 2020-07-31: qty 1

## 2020-07-31 MED ORDER — INSULIN ASPART 100 UNIT/ML ~~LOC~~ SOLN: 0.0000 [IU] | Freq: Three times a day (TID) | SUBCUTANEOUS | Status: DC

## 2020-07-31 MED ORDER — ACETAMINOPHEN 160 MG/5ML PO SOLN
650.0000 mg | ORAL | Status: DC | PRN
  Filled 2020-07-31: qty 20.3

## 2020-07-31 MED ORDER — FLUTICASONE PROPIONATE 50 MCG/ACT NA SUSP
2.0000 | Freq: Every day | NASAL | Status: DC
  Administered 2020-08-02 – 2020-08-06 (×5): 2 via NASAL
  Filled 2020-07-31 (×2): qty 16

## 2020-07-31 MED ORDER — SODIUM CHLORIDE 0.9 % IV SOLN: INTRAVENOUS | Status: DC

## 2020-07-31 MED ORDER — ASPIRIN 325 MG PO TABS
325.0000 mg | ORAL_TABLET | Freq: Every day | ORAL | Status: DC
  Administered 2020-08-01 – 2020-08-06 (×6): 325 mg via ORAL
  Filled 2020-07-31 (×6): qty 1

## 2020-07-31 MED ORDER — INSULIN ASPART 100 UNIT/ML ~~LOC~~ SOLN
2.0000 [IU] | Freq: Once | SUBCUTANEOUS | Status: AC
  Administered 2020-07-31: 2 [IU] via SUBCUTANEOUS

## 2020-07-31 MED ORDER — IOHEXOL 350 MG/ML SOLN
100.0000 mL | Freq: Once | INTRAVENOUS | Status: AC | PRN
  Administered 2020-07-31: 100 mL via INTRAVENOUS

## 2020-07-31 NOTE — ED Notes (Signed)
C-collar removed per Dr. Dalia Heading

## 2020-07-31 NOTE — Code Documentation (Signed)
Stroke Response Nurse Documentation Code Documentation  Jonathan Miranda is a 69 y.o. male arriving to [place] ED via [facility] on 07/31/20 with past medical hx significant of hyperlipidemia, tinnitus,hypertention. Code stroke was activated by EMS. Patient from home where he was LKW at 2000 10/26. He was found down today. Amnesic of why on the floor and unable to move right arm.    Stroke team at the bedside on patient arrival. Labs drawn and patient cleared for CT by Dr. Fransico Michael. Patient to CT with team. NIHSS 4, see documentation for details and code stroke times. Patient with disoriented, right arm weakness, right leg weakness and Expressive aphasia  on exam.   The following imaging was completed: CT, CTA head and neck, CTP. Patient is not a candidate for tPA due to out of window. Care/Plan Q2 VS/neuro x12 h, then Q4. Bedside handoff with ED RN Rise Paganini.    Leverne Humbles  Stroke Response RN

## 2020-07-31 NOTE — ED Triage Notes (Addendum)
Per [facility]: Pts friend checked on pt last night and last saw pt around 8 pm. Same friend went to check on pt today and found the pt to be on the floor of his dining room naked and confused. Friend stated that pt takes a blood thinner and has cancer and is being [place] and "just had a treatment yesterday" 07-30-2020. Careeverywhere shows pt has cancer in multiple places in body. EMS activated code stroke. Pt arrives with c-collar in place. Pt with right sided weakness.

## 2020-07-31 NOTE — Progress Notes (Signed)
EEG Completed; Results Pending  

## 2020-07-31 NOTE — ED Notes (Signed)
Patient transported to MRI 

## 2020-07-31 NOTE — ED Notes (Signed)
Bed alarm placed on bed.

## 2020-07-31 NOTE — ED Notes (Signed)
This RN spoke to pts sister, with pts permission, and updated her about pts plan of care.

## 2020-07-31 NOTE — ED Notes (Signed)
Yellow socks and falls band applied to pt.  

## 2020-07-31 NOTE — H&P (Signed)
History and Physical    Jonathan Miranda UTM:[id number] DOB: [birth year] DOA: 07/31/2020  PCP: Biagio Borg, MD    Patient coming from:  Home.  Chief Complaint:  Home.  HPI: Jonathan Miranda is a 69 y.o. male with medical history significant of hypertension, hyperlipidemia, metastatic adenocarcinoma of the lung and bladder cancer seen in the emergency room for encephalopathy.  Code stroke called on patient arrival.No family at bedside. HPi is limited due to pt being confused, he states he fell does not know when and how and when he woke up ems was there.Pt lives alone and per edmd note pt  was found on the floor and was last seen normal by friend who takes care of him yesterday  ED Course:  Blood pressure (!) 158/78, pulse (!) 106, temperature 98.5 F (36.9 C), temperature source Oral, resp. rate 17, weight 50.3 kg, SpO2 100 %. pt seen in ed by neurology as code stroke was called. Imagine negative for any acute imafrction. Pt is alert and awake and orietned per edmd.  In ed pt had brain imaging which was negative for an acute infarct and neurology has seen pt and EEG was in progress when I was seeing pt, and results are suggestive of mild to moderate diffuse encephalopathy.Covid-19 is pending.  Review of Systems: As per HPI otherwise all systems reviewed and negative.  Past Medical History:  Diagnosis Date  . Allergic rhinitis   . Aortic stenosis, mild 11/28/2015  . Back pain   . Cancer ([place])   . Hep A w/o coma   . Hyperlipidemia   . Hypertension   . Nephrolithiasis   . Personal history of colonic polyps   . Prostatitis   . Rash   . Tinnitus     Past Surgical History:  Procedure Laterality Date  . correction deviated septum    . ORIF wrist- '05    . ureteroscopyp       reports that he has never smoked. He has never used smokeless tobacco. He reports current alcohol use. He reports that he does not use drugs.  Allergies  Allergen Reactions  . Iodine      REACTION: Hives  . Penicillins     REACTION: Hives  . Statins     Family History  Problem Relation Age of Onset  . Heart disease Mother   . Hyperlipidemia Mother   . Hypertension Mother   . Diabetes Father     Prior to Admission medications   Medication Sig Start Date End Date Taking? Authorizing Provider  aspirin EC 81 MG tablet Take 1 tablet (81 mg total) by mouth daily. 11/04/16   Biagio Borg, MD  cimetidine (TAGAMET) 400 MG tablet Take 400 mg by mouth 3 (three) times daily. 06/05/20   [provider]  cyclobenzaprine (FLEXERIL) 10 MG tablet TAKE 1 TABLET BY MOUTH THREE TIMES DAILY AS NEEDED FOR MUSCLE SPASMS 03/16/17   Biagio Borg, MD  dextromethorphan-guaiFENesin [facility] DM) 30-600 MG 12hr tablet Take 1 tablet by mouth 2 (two) times daily as needed for cough. 01/05/17   Nche, Charlene Brooke, NP  diclofenac (VOLTAREN) 75 MG EC tablet TAKE 1 TABLET BY MOUTH TWICE DAILY 09/24/16   Biagio Borg, MD  diclofenac sodium (VOLTAREN) 1 % GEL Apply 4 g topically 4 (four) times daily as needed. 05/04/17   Biagio Borg, MD  doxycycline (VIBRA-TABS) 100 MG tablet TAKE 1 TABLET BY MOUTH TWICE DAILY AS NEEDED FOR RECURRENT PROSTATITIS 07/19/17  Biagio Borg, MD  doxycycline (VIBRAMYCIN) 100 MG capsule Take 100 mg by mouth 2 (two) times daily as needed. 06/16/20   [provider]  ezetimibe (ZETIA) 10 MG tablet Take 1 tablet (10 mg total) by mouth daily. 05/04/17   Biagio Borg, MD  fluticasone (FLONASE) 50 MCG/ACT nasal spray Place 2 sprays into both nostrils daily. 01/05/17   Nche, Charlene Brooke, NP  HYDROcodone-acetaminophen (NORCO/VICODIN) 5-325 MG tablet Take 1 tablet by mouth every 6 (six) hours as needed. 07/22/20   [provider]  hydrOXYzine (ATARAX/VISTARIL) 10 MG tablet Take 10 mg by mouth 1 day or 1 dose. 11/07/16   [provider]  hydrOXYzine (VISTARIL) 25 MG capsule Take 25 mg by mouth 2 (two) times daily. 05/31/20   [provider]  lisinopril  (PRINIVIL,ZESTRIL) 20 MG tablet TAKE 1 TABLET BY MOUTH DAILY 10/11/17   Biagio Borg, MD  lisinopril (ZESTRIL) 40 MG tablet Take 40 mg by mouth daily. 06/29/20   [provider]  LORazepam (ATIVAN) 0.5 MG tablet Take by mouth. 07/22/20   [provider]  metFORMIN (GLUCOPHAGE-XR) 500 MG 24 hr tablet Take 2 tablets (1,000 mg total) by mouth daily with breakfast. 05/05/17   Biagio Borg, MD  predniSONE (DELTASONE) 50 MG tablet Take by mouth. 07/17/20   [provider]  promethazine-dextromethorphan (PROMETHAZINE-DM) 6.25-15 MG/5ML syrup Take 5 mLs by mouth 3 (three) times daily as needed for cough. 01/05/17   Nche, Charlene Brooke, NP  traMADol (ULTRAM) 50 MG tablet Take 50 mg by mouth every 6 (six) hours as needed. 05/21/20   [provider]    Physical Exam: Vitals:   07/31/20 1445 07/31/20 1500 07/31/20 1515 07/31/20 1530  BP: (!) 155/70 (!) 146/77 (!) 153/73 (!) 158/78  Pulse: (!) 105 (!) 112 (!) 114 (!) 106  Resp: (!) 22 18 17 17   Temp:      TempSrc:      SpO2: 100% 100% 100% 100%  Weight:        Constitutional: NAD, calm, comfortable Vitals:   07/31/20 1445 07/31/20 1500 07/31/20 1515 07/31/20 1530  BP: (!) 155/70 (!) 146/77 (!) 153/73 (!) 158/78  Pulse: (!) 105 (!) 112 (!) 114 (!) 106  Resp: (!) 22 18 17 17   Temp:      TempSrc:      SpO2: 100% 100% 100% 100%  Weight:       Eyes: PERRL, EOMI lids and conjunctivae normal ENMT: Mucous membranes are moist. Posterior pharynx clear of any exudate or lesions.Normal dentition. Tongue midline. Neck: normal, supple, no masses, no thyromegaly, no carotid bruit  Respiratory: clear to auscultation bilaterally, no wheezing, no crackles. Normal respiratory effort. No accessory muscle use.  Cardiovascular: Regular rate and rhythm, no murmurs / rubs / gallops. No extremity edema. 2+ pedal pulses. No carotid bruits.  Abdomen: no tenderness, no masses palpated. No hepatosplenomegaly. Bowel sounds positive.    Musculoskeletal: no clubbing / cyanosis. No joint deformity upper and lower extremities. Pt moving all four ext , no contractures. Normal muscle tone.  Skin: no rashes, lesions, ulcers. No induration Neurologic: CN 2-12 grossly intact. Sensation intact, DTR 1 +. Strength 5/5  On left side and 4/5 on right side.  Pt follows command. Psychiatric: Normal judgment and insight. Alert and oriented x 3. Normal mood.   Labs on Admission: I have personally reviewed following labs and imaging studies  CBC: Recent Labs  Lab 07/31/20 1323 07/31/20 1327  WBC 23.6*  --  NEUTROABS 18.5*  --   HGB 11.3* 11.6*  HCT 34.3* 34.0*  MCV 87.3  --   PLT 294  --    Basic Metabolic Panel: Recent Labs  Lab 07/31/20 1323 07/31/20 1327  NA 137 136  K 4.4 4.6  CL 102 103  CO2 19*  --   GLUCOSE 235* 235*  BUN 31* 39*  CREATININE 1.38* 1.20  CALCIUM 9.5  --    GFR: CrCl cannot be calculated (Unknown ideal weight.). Liver Function Tests: Recent Labs  Lab 07/31/20 1323  AST 36  ALT 20  ALKPHOS 176*  BILITOT 1.5*  PROT 6.0*  ALBUMIN 3.2*   No results for input(s): LIPASE, AMYLASE in the last 168 hours. No results for input(s): AMMONIA in the last 168 hours. Coagulation Profile: Recent Labs  Lab 07/31/20 1323  INR 1.5*   Cardiac Enzymes: No results for input(s): CKTOTAL, CKMB, CKMBINDEX, TROPONINI in the last 168 hours. BNP (last 3 results) No results for input(s): PROBNP in the last 8760 hours. HbA1C: No results for input(s): HGBA1C in the last 72 hours. CBG: Recent Labs  Lab 07/31/20 1320  GLUCAP 213*   Lipid Profile: No results for input(s): CHOL, HDL, LDLCALC, TRIG, CHOLHDL, LDLDIRECT in the last 72 hours. Thyroid Function Tests: No results for input(s): TSH, T4TOTAL, FREET4, T3FREE, THYROIDAB in the last 72 hours. Anemia Panel: No results for input(s): VITAMINB12, FOLATE, FERRITIN, TIBC, IRON, RETICCTPCT in the last 72 hours. Urine analysis:    Component Value Date/Time    COLORURINE YELLOW 07/31/2020 1459   APPEARANCEUR CLEAR 07/31/2020 1459   LABSPEC 1.034 (H) 07/31/2020 1459   PHURINE 6.0 07/31/2020 1459   GLUCOSEU 50 (A) 07/31/2020 1459   GLUCOSEU NEGATIVE 05/04/2017 1622   HGBUR MODERATE (A) 07/31/2020 1459   BILIRUBINUR NEGATIVE 07/31/2020 1459   KETONESUR 20 (A) 07/31/2020 1459   PROTEINUR 30 (A) 07/31/2020 1459   UROBILINOGEN 0.2 05/04/2017 1622   NITRITE NEGATIVE 07/31/2020 1459   LEUKOCYTESUR NEGATIVE 07/31/2020 1459    Intake/Output Summary (Last 24 hours) at 07/31/2020 1648 Last data filed at 07/31/2020 1453 Gross per 24 hour  Intake --  Output 50 ml  Net -50 ml   Lab Results  Component Value Date   CREATININE 1.20 07/31/2020   CREATININE 1.38 (H) 07/31/2020   CREATININE 0.99 05/04/2017    COVID-19 Labs  No results for input(s): DDIMER, FERRITIN, LDH, CRP in the last 72 hours.  No results found for: [place] on Admission: CT Code Stroke CTA Head W/WO contrast  Result Date: 07/31/2020 CLINICAL DATA:  Right-sided weakness EXAM: CT ANGIOGRAPHY HEAD AND NECK CT PERFUSION BRAIN TECHNIQUE: Multidetector CT imaging of the head and neck was performed using the standard protocol during bolus administration of intravenous contrast. Multiplanar CT image reconstructions and MIPs were obtained to evaluate the vascular anatomy. Carotid stenosis measurements (when applicable) are obtained utilizing NASCET criteria, using the distal internal carotid diameter as the denominator. Multiphase CT imaging of the brain was performed following IV bolus contrast injection. Subsequent parametric perfusion maps were calculated using RAPID software. CONTRAST:  162mL OMNIPAQUE IOHEXOL 350 MG/ML SOLN COMPARISON:  None. FINDINGS: CTA NECK Aortic arch: Mild plaque along the aortic arch and great vessel origins, which are patent. Right carotid system: Patent. Primarily calcified plaque at the ICA origin causing less than 50% stenosis. Left  carotid system: Patent. Primarily calcified plaque at the ICA origin causing less than 50% stenosis. Vertebral arteries: Patent.  Left vertebral artery is dominant. Skeleton:  Advanced multilevel degenerative changes of the cervical spine. Degenerative changes of the temporomandibular joints. Other neck: No neck mass. Right supraclavicular adenopathy identified on prior chest CT appears to be present but poorly evaluated due to artifact. Upper chest: Included upper lungs are clear. Review of the MIP images confirms the above findings CTA HEAD Anterior circulation: Intracranial internal carotid arteries patent with mild calcified plaque. Anterior and middle cerebral arteries are patent. Posterior circulation: Intracranial vertebral arteries are patent. Minimal calcified plaque on the left. Basilar artery is patent. Posterior cerebral arteries are patent. Venous sinuses: Patent as allowed by contrast bolus timing. No apparent enhancement in the region of bilateral frontal hypodense lesions. Review of the MIP images confirms the above findings CT Brain Perfusion Findings: CBF (<30%) Volume: 27mL Perfusion (Tmax>6.0s) volume: 79mL Mismatch Volume: 49mL Infarction Location: None IMPRESSION: No large vessel occlusion or hemodynamically significant stenosis. Perfusion imaging demonstrates no evidence of core infarction or penumbra. No apparent enhancement or definite hyperperfusion in the region of low-density lesions on head CT. Electronically Signed   By: Macy Mis M.D.   On: 07/31/2020 14:11   CT Code Stroke CTA Neck W/WO contrast Result Date: 07/31/2020 CLINICAL DATA:  Right-sided weakness EXAM: CT ANGIOGRAPHY HEAD AND NECK CT PERFUSION BRAIN TECHNIQUE: Multidetector CT imaging of the head and neck was performed using the standard protocol during bolus administration of intravenous contrast. Multiplanar CT image reconstructions and MIPs were obtained to evaluate the vascular anatomy. Carotid stenosis measurements  (when applicable) are obtained utilizing NASCET criteria, using the distal internal carotid diameter as the denominator. Multiphase CT imaging of the brain was performed following IV bolus contrast injection. Subsequent parametric perfusion maps were calculated using RAPID software. CONTRAST:  143mL OMNIPAQUE IOHEXOL 350 MG/ML SOLN COMPARISON:  None. FINDINGS: CTA NECK Aortic arch: Mild plaque along the aortic arch and great vessel origins, which are patent. Right carotid system: Patent. Primarily calcified plaque at the ICA origin causing less than 50% stenosis. Left carotid system: Patent. Primarily calcified plaque at the ICA origin causing less than 50% stenosis. Vertebral arteries: Patent.  Left vertebral artery is dominant. Skeleton: Advanced multilevel degenerative changes of the cervical spine. Degenerative changes of the temporomandibular joints. Other neck: No neck mass. Right supraclavicular adenopathy identified on prior chest CT appears to be present but poorly evaluated due to artifact. Upper chest: Included upper lungs are clear. Review of the MIP images confirms the above findings CTA HEAD Anterior circulation: Intracranial internal carotid arteries patent with mild calcified plaque. Anterior and middle cerebral arteries are patent. Posterior circulation: Intracranial vertebral arteries are patent. Minimal calcified plaque on the left. Basilar artery is patent. Posterior cerebral arteries are patent. Venous sinuses: Patent as allowed by contrast bolus timing. No apparent enhancement in the region of bilateral frontal hypodense lesions. Review of the MIP images confirms the above findings CT Brain Perfusion Findings: CBF (<30%) Volume: 28mL Perfusion (Tmax>6.0s) volume: 30mL Mismatch Volume: 45mL Infarction Location: None  IMPRESSION: No large vessel occlusion or hemodynamically significant stenosis. Perfusion imaging demonstrates no evidence of core infarction or penumbra. No apparent enhancement or  definite hyperperfusion in the region of low-density lesions on head CT. Electronically Signed   By: Macy Mis M.D.   On: 07/31/2020 14:11   CT C-SPINE NO CHARGE Result Date: 07/31/2020 CLINICAL DATA:  69 year old male with fall and neck trauma. EXAM: CT CERVICAL SPINE WITHOUT CONTRAST TECHNIQUE: Multidetector CT imaging of the cervical spine was performed without intravenous contrast. Multiplanar CT image reconstructions were also generated.  COMPARISON:  CT angiography dated 07/31/2020. FINDINGS: Alignment: Acute subluxation. Grade 1 C4-C5 anterolisthesis. Skull base and vertebrae: No acute fracture. Soft tissues and spinal canal: No prevertebral fluid or swelling. No visible canal hematoma. Disc levels: Multilevel degenerative changes with endplate irregularity and disc space narrowing and spurring. Multilevel facet arthropathy. Upper chest: Negative. Other: A subcentimeter right thyroid hypodense nodule. Not clinically significant; no follow-up imaging recommended (ref: J Am Coll Radiol. 2015 Feb;12(2): 143-50). Bilateral carotid bulb calcified plaques with  IMPRESSION: 1. No acute/traumatic cervical spine pathology. 2. Multilevel degenerative changes. Electronically Signed   By: Anner Crete M.D.   On: 07/31/2020 16:13   CT Code Stroke Cerebral Perfusion with contrast Result Date: 07/31/2020 CLINICAL DATA:  Right-sided weakness EXAM: CT ANGIOGRAPHY HEAD AND NECK CT PERFUSION BRAIN TECHNIQUE: Multidetector CT imaging of the head and neck was performed using the standard protocol during bolus administration of intravenous contrast. Multiplanar CT image reconstructions and MIPs were obtained to evaluate the vascular anatomy. Carotid stenosis measurements (when applicable) are obtained utilizing NASCET criteria, using the distal internal carotid diameter as the denominator. Multiphase CT imaging of the brain was performed following IV bolus contrast injection. Subsequent parametric perfusion maps  were calculated using RAPID software. CONTRAST:  153mL OMNIPAQUE IOHEXOL 350 MG/ML SOLN COMPARISON:  None. FINDINGS: CTA NECK Aortic arch: Mild plaque along the aortic arch and great vessel origins, which are patent. Right carotid system: Patent. Primarily calcified plaque at the ICA origin causing less than 50% stenosis. Left carotid system: Patent. Primarily calcified plaque at the ICA origin causing less than 50% stenosis. Vertebral arteries: Patent.  Left vertebral artery is dominant. Skeleton: Advanced multilevel degenerative changes of the cervical spine. Degenerative changes of the temporomandibular joints. Other neck: No neck mass. Right supraclavicular adenopathy identified on prior chest CT appears to be present but poorly evaluated due to artifact. Upper chest: Included upper lungs are clear. Review of the MIP images confirms the above findings CTA HEAD Anterior circulation: Intracranial internal carotid arteries patent with mild calcified plaque. Anterior and middle cerebral arteries are patent. Posterior circulation: Intracranial vertebral arteries are patent. Minimal calcified plaque on the left. Basilar artery is patent. Posterior cerebral arteries are patent. Venous sinuses: Patent as allowed by contrast bolus timing. No apparent enhancement in the region of bilateral frontal hypodense lesions. Review of the MIP images confirms the above findings CT Brain Perfusion Findings: CBF (<30%) Volume: 27mL Perfusion (Tmax>6.0s) volume: 39mL Mismatch Volume: 22mL Infarction Location: None  IMPRESSION: No large vessel occlusion or hemodynamically significant stenosis. Perfusion imaging demonstrates no evidence of core infarction or penumbra. No apparent enhancement or definite hyperperfusion in the region of low-density lesions on head CT. Electronically Signed   By: Macy Mis M.D.   On: 07/31/2020 14:11   EEG adult Result Date: 07/31/2020 Lora Havens, MD     07/31/2020  3:29 PM Patient Name:  Jonathan Miranda MRN: [id number] Epilepsy Attending: Lora Havens Referring Physician/Provider: Dr. Kathrynn Speed Date: 07/31/2020 Duration: 23.56 mins Patient history: 69 year old male presented with sudden onset of right-sided weakness.  EEG done for seizures. Level of alertness: Awake, asleep AEDs during EEG study: None Technical aspects: This EEG study was done with scalp electrodes positioned according to the 10-20 International system of electrode placement. Electrical activity was acquired at a sampling rate of 500Hz  and reviewed with a high frequency filter of 70Hz  and a low frequency filter of 1Hz . EEG data were recorded continuously and digitally stored. Description: During awake state, no  clear posterior dominant rhythm was seen.  Sleep was characterized by vertex waves, sleep spindles (12 to 14 Hz), maximal frontocentral region.  EEG showed continuous generalized at times rhythmic 3 to 6 Hz theta-delta slowing.  Hyperventilation and photic stimulation were not performed.   ABNORMALITY -Continuous slow, generalized IMPRESSION: This study is suggestive of mild to moderate diffuse encephalopathy, nonspecific etiology.  No seizures or epileptiform discharges were seen throughout the recording. Lora Havens   CT HEAD CODE STROKE WO CONTRAST Result Date: 07/31/2020 CLINICAL DATA:  Code stroke.  Right-sided weakness EXAM: CT HEAD WITHOUT CONTRAST TECHNIQUE: Contiguous axial images were obtained from the base of the skull through the vertex without intravenous contrast. COMPARISON:  None. FINDINGS: Significant streak artifact through the skull base and inferior posterior fossa due to dental amalgam. Brain: There is no acute intracranial hemorrhage. Low-density lesion of the left middle frontal gyrus measures up to 1.5 cm coronally. Additional right middle frontal gyrus low-density lesion measures about 9 mm. Question of small area of loss of gray-white differentiation at the junction of  superior and precentral gyri (series 3, image 29). Prominence of the ventricles and sulci reflects generalized parenchymal volume loss. No extra-axial collection or hydrocephalus. Vascular: No hyperdense vessel. There is mild intracranial atherosclerotic calcification at the skull base. Skull: Unremarkable Sinuses/Orbits: Evidence of prior sinonasal surgery. Small right maxillary sinus air-fluid level. No acute abnormality of the orbits. Other: Mastoid air cells are clear. ASPECTS [facility] Stroke Program Early CT Score) - Ganglionic level infarction (caudate, lentiform nuclei, internal capsule, insula, M1-M3 cortex): 7 - Supraganglionic infarction (M4-M6 cortex): 2 Total score (0-10 with 10 being normal): 9  IMPRESSION: No acute intracranial hemorrhage. Possible small area of acute infarction at the junction of left superior and precentral gyri. However, this is contralateral to expected location for right-sided symptoms. Bilateral frontal hypodense lesions, which could reflect areas of encephalomalacia. However, there is no significant associated volume loss and metastatic disease is a consideration given apparent progression on recent cross-sectional imaging. These results were communicated to Dr. Leonel Ramsay at 1:41 pm on 07/31/2020 by text page via the [facility] messaging system. Electronically Signed   By: Macy Mis M.D.   On: 07/31/2020 14:13    EKG: Independently reviewed. Sinus tach @ 114, no st changes or TWI.  Assessment/Plan Principal Problem:   AMS (altered mental status) Active Problems:   HYPERLIPIDEMIA, MIXED   Essential hypertension   Diabetes ([place]) AMS: Suspect pt has had a cva, on exam he is slight weak n rt side and could not recognize his right hand but then followed commands and had normal strength. We will admit pt to med tele and start asa / cont zetia. Pt is allergic to statin suspect myalgia not true allergy.  MRI brain is pending and we will obtain echo. Pt Denies any  alcohol but is shaky on exam will start thiamine.  Home regimen of bp meds held to allow for permissive htn, also doses are not clear. Pt has two lisinopril prescription.NPO until bedside swallow. PT/OT consult suspect pt needs snf.  Covid is pending.    Hyperlipidemia: continue Zetia . Am lipid panel.  HTN; Blood pressure regimen with home medications held to allow for permissive hypertension. Currently n.p.o., after swallow evaluation patient can start cardiac carb consistent cardiac low-sodium diet.  Patient also would benefit from protein supplementation and a nutritionist consult.  Diabetes mellitus type 2: Patient is on Metformin at home which I have held currently as he has received contrast  for imaging studies today.  However I suspect this hyperglycemia may be related to his prednisone although I do not know how long the patient has been taking the prednisone. I suspect we can discontinue patient's Metformin will obtain A1c sliding scale insulin and hypoglycemia protocol.    DVT prophylaxis:  Heparin  Code Status:  Full   Family Communication:  None at bedside.  Disposition Plan:  TBD  Consults called:  Neurology: Dr.kirkpatrick.  Admission status: Inpatient   Para Skeans MD [facility] Pager [phone] If 7PM-7AM, please contact night-coverage [email or web address] Password [facility] 07/31/2020, 4:48 PM

## 2020-07-31 NOTE — Consult Note (Signed)
Neurology Consultation Reason for Consult: Right sided weakness Referring Physician: Calla Kicks  CC: Right sided weakness  History is obtained from:EMS   HPI: Jonathan Miranda is a 69 y.o. male with a history of metastatic adenocarcinoma of the lung as well as highly invasive bladder cancer who was last seen in his normal state yesterday around 8pm by his friend. Today, he was found down on the floor in his dining room. The patient does not know how long he was there. He was brought in as a code stroke due to having right-sided weakness.   LKW: 8pm yesterday tpa given?: no, out of window.     ROS: A 14 point ROS was performed and is negative except as noted in the HPI.  Past Medical History:  Diagnosis Date  . Allergic rhinitis   . Aortic stenosis, mild 11/28/2015  . Back pain   . Hep A w/o coma   . Hyperlipidemia   . Hypertension   . Nephrolithiasis   . Personal history of colonic polyps   . Prostatitis   . Rash   . Tinnitus      Family History  Problem Relation Age of Onset  . Heart disease Mother   . Hyperlipidemia Mother   . Hypertension Mother   . Diabetes Father      Social History:  reports that he has never smoked. He has never used smokeless tobacco. He reports current alcohol use. He reports that he does not use drugs.   Exam: Current vital signs: There were no vitals filed for this visit.  Vital signs in last 24 hours:     Physical Exam  Constitutional: Appears thin  Psych: Affect appropriate to situation Eyes: No scleral injection HENT: No OP obstrucion MSK: no joint deformities.  Cardiovascular: Normal rate and regular rhythm.  Respiratory: Effort normal, non-labored breathing GI: Soft.  No distension. There is no tenderness.  Skin: WDI  Neuro: Mental Status: Patient is awake, alert, oriented to person, place, month, age No signs of aphasia or neglect Cranial Nerves: II: Visual Fields are full. Pupils are equal, round, and reactive  to light.   III,IV, VI: EOMI without ptosis or diploplia.  V: Facial sensation is diminished on the right VII: Facial movement with right facial weakness Motor: He has severe weakness of the right arm, 4/5 right leg. Good strength in the left arm and leg.  Sensory: Sensation is diminished on the right arm, he has diminished sensation on bilateral legs.  Cerebellar: Unable to perform on the right.    I have reviewed labs in epic and the results pertinent to this consultation are: Leukocytosis at 23.6  I have reviewed the images obtained:Cr 1.2  Impression: 69 yo M with metastatic disease to the brain and right sided weakness. Possibililities include direct effect of edema vs post-ictal state. He will need MRI imaging for further characterization.  Recommendations: 1) EEG 2) MRI with and without contrast 3) further recommendations after the above studies.   Roland Rack, MD [facility] [phone]  If 7pm- 7am, please page neurology on call as listed in [place].

## 2020-07-31 NOTE — ED Notes (Signed)
Pt's CBG result was 213. Informed Lynnze - RN.

## 2020-07-31 NOTE — ED Provider Notes (Signed)
[facility] EMERGENCY DEPARTMENT Provider Note   CSN: [id number] Arrival date & time: 07/31/20  1318  An emergency department physician performed an initial assessment on this suspected stroke patient at 1320.  History Chief Complaint  Patient presents with  . Code Stroke  . Fall    Jonathan Miranda is a 69 y.o. male.  HPI Patient does not recall what happened earlier today.  He does understand that he fell.  He was told that he fell.  He denies any has any pain.  He denies headache, chest pain, neck pain.  He denies it seems as though there is injury to extremities.  Patient has metastatic adenocarcinoma of the lung and highly invasive bladder cancer.  Yesterday he was well and seen by a friend at 8 PM.  Today, he was found in the dining room floor confusion and undressed.  Unclear how long he had been there.  Patient arrived as code stroke.  Per the patient, he has been at baseline state of health.  He does not recall being sick beyond the fact that, he obviously has extensive cancer.  He does not recall any recent fevers or generalized body aches.  He denies he has been vomiting or having diarrhea.  He denies he is having pain with urination.    Past Medical History:  Diagnosis Date  . Allergic rhinitis   . Aortic stenosis, mild 11/28/2015  . Back pain   . Cancer ([place])   . Hep A w/o coma   . Hyperlipidemia   . Hypertension   . Nephrolithiasis   . Personal history of colonic polyps   . Prostatitis   . Rash   . Tinnitus     Patient Active Problem List   Diagnosis Date Noted  . Gross hematuria 05/04/2017  . Left ear hearing loss 11/04/2016  . Aortic stenosis, mild 11/28/2015  . Chronic prostatitis 11/01/2015  . Chronic hyponatremia 07/26/2014  . Diabetes ([place]) 07/19/2014  . Routine health maintenance 07/28/2011  . BACK PAIN 12/14/2008  . HYPERLIPIDEMIA, MIXED 05/06/2007  . TINNITUS NOS 05/06/2007  . Essential hypertension 05/06/2007  . ALLERGIC  RHINITIS 05/06/2007  . COLONIC POLYPS, HX OF 05/06/2007  . NEPHROLITHIASIS, HX OF 05/06/2007    Past Surgical History:  Procedure Laterality Date  . correction deviated septum    . ORIF wrist- '05    . ureteroscopyp         Family History  Problem Relation Age of Onset  . Heart disease Mother   . Hyperlipidemia Mother   . Hypertension Mother   . Diabetes Father     Social History   Tobacco Use  . Smoking status: Never Smoker  . Smokeless tobacco: Never Used  Substance Use Topics  . Alcohol use: Yes  . Drug use: No    Home Medications Prior to Admission medications   Medication Sig Start Date End Date Taking? Authorizing Provider  aspirin EC 81 MG tablet Take 1 tablet (81 mg total) by mouth daily. 11/04/16   Biagio Borg, MD  cyclobenzaprine (FLEXERIL) 10 MG tablet TAKE 1 TABLET BY MOUTH THREE TIMES DAILY AS NEEDED FOR MUSCLE SPASMS 03/16/17   Biagio Borg, MD  dextromethorphan-guaiFENesin [facility] DM) 30-600 MG 12hr tablet Take 1 tablet by mouth 2 (two) times daily as needed for cough. 01/05/17   Nche, Charlene Brooke, NP  diclofenac (VOLTAREN) 75 MG EC tablet TAKE 1 TABLET BY MOUTH TWICE DAILY 09/24/16   Biagio Borg, MD  diclofenac sodium (VOLTAREN) 1 % GEL Apply 4 g topically 4 (four) times daily as needed. 05/04/17   Biagio Borg, MD  doxycycline (VIBRA-TABS) 100 MG tablet TAKE 1 TABLET BY MOUTH TWICE DAILY AS NEEDED FOR RECURRENT PROSTATITIS 07/19/17   Biagio Borg, MD  ezetimibe (ZETIA) 10 MG tablet Take 1 tablet (10 mg total) by mouth daily. 05/04/17   Biagio Borg, MD  fluticasone (FLONASE) 50 MCG/ACT nasal spray Place 2 sprays into both nostrils daily. 01/05/17   Nche, Charlene Brooke, NP  hydrOXYzine (ATARAX/VISTARIL) 10 MG tablet Take 10 mg by mouth 1 day or 1 dose. 11/07/16   [provider]  lisinopril (PRINIVIL,ZESTRIL) 20 MG tablet TAKE 1 TABLET BY MOUTH DAILY 10/11/17   Biagio Borg, MD  metFORMIN (GLUCOPHAGE-XR) 500 MG 24 hr tablet Take 2 tablets (1,000  mg total) by mouth daily with breakfast. 05/05/17   Biagio Borg, MD  promethazine-dextromethorphan (PROMETHAZINE-DM) 6.25-15 MG/5ML syrup Take 5 mLs by mouth 3 (three) times daily as needed for cough. 01/05/17   Nche, Charlene Brooke, NP    Allergies    Iodine, Penicillins, and Statins  Review of Systems   Review of Systems 10 systems reviewed and negative except as per HPI. Physical Exam Updated Vital Signs BP (!) 143/95   Pulse (!) 110   Temp 98.5 F (36.9 C) (Oral)   Resp 16   Wt 50.3 kg   SpO2 100%   BMI 17.37 kg/m   Physical Exam Constitutional:      Comments: Patient is alert.  GCS is 15.  He is answering questions.  No respiratory distress.  Cervical collar in place.  HENT:     Head: Normocephalic and atraumatic.     Mouth/Throat:     Pharynx: Oropharynx is clear.  Eyes:     Extraocular Movements: Extraocular movements intact.  Cardiovascular:     Rate and Rhythm: Tachycardia present.  Pulmonary:     Effort: Pulmonary effort is normal.     Breath sounds: Normal breath sounds.  Abdominal:     General: There is no distension.     Palpations: Abdomen is soft.     Tenderness: There is no abdominal tenderness. There is no guarding.  Musculoskeletal:        General: No swelling or deformity. Normal range of motion.  Skin:    General: Skin is warm and dry.     Coloration: Skin is pale.  Neurological:     Comments: No focal deficits.  Patient is answering questions.  He does not have recall for the event.  Patient appears to have a tremor.  He follows commands to perform grip strength bilaterally.  Slightly weak upper extremities bilaterally but symmetric.  Patient can flex and extend both lower extremities.  Exam suggest general weakness but no localizing deficit.  Psychiatric:        Mood and Affect: Mood normal.     ED Results / Procedures / Treatments   Labs (all labs ordered are listed, but only abnormal results are displayed) Labs Reviewed  PROTIME-INR -  Abnormal; Notable for the following components:      Result Value   Prothrombin Time 17.2 (*)    INR 1.5 (*)    All other components within normal limits  CBC - Abnormal; Notable for the following components:   WBC 23.6 (*)    RBC 3.93 (*)    Hemoglobin 11.3 (*)    HCT 34.3 (*)    All  other components within normal limits  DIFFERENTIAL - Abnormal; Notable for the following components:   Neutro Abs 18.5 (*)    Monocytes Absolute 2.7 (*)    Abs Immature Granulocytes 0.21 (*)    All other components within normal limits  COMPREHENSIVE METABOLIC PANEL - Abnormal; Notable for the following components:   CO2 19 (*)    Glucose, Bld 235 (*)    BUN 31 (*)    Creatinine, Ser 1.38 (*)    Total Protein 6.0 (*)    Albumin 3.2 (*)    Alkaline Phosphatase 176 (*)    Total Bilirubin 1.5 (*)    GFR, Estimated 55 (*)    Anion gap 16 (*)    All other components within normal limits  CBG MONITORING, ED - Abnormal; Notable for the following components:   Glucose-Capillary 213 (*)    All other components within normal limits  I-STAT CHEM 8, ED - Abnormal; Notable for the following components:   BUN 39 (*)    Glucose, Bld 235 (*)    TCO2 21 (*)    Hemoglobin 11.6 (*)    HCT 34.0 (*)    All other components within normal limits  ETHANOL  APTT  RAPID URINE DRUG SCREEN, HOSP PERFORMED  URINALYSIS, ROUTINE W REFLEX MICROSCOPIC    EKG EKG Interpretation  Date/Time:  Wednesday July 31 2020 13:58:48 EDT Ventricular Rate:  114 PR Interval:    QRS Duration: 76 QT Interval:  337 QTC Calculation: 465 R Axis:   -38 Text Interpretation: Sinus tachycardia Consider left atrial enlargement Left axis deviation Low voltage, extremity leads no acte ischemic change, no old Confirmed by Charlesetta Shanks [phone]) on 07/31/2020 2:21:16 PM   Radiology CT Code Stroke CTA Head W/WO contrast  Result Date: 07/31/2020 CLINICAL DATA:  Right-sided weakness EXAM: CT ANGIOGRAPHY HEAD AND NECK CT PERFUSION  BRAIN TECHNIQUE: Multidetector CT imaging of the head and neck was performed using the standard protocol during bolus administration of intravenous contrast. Multiplanar CT image reconstructions and MIPs were obtained to evaluate the vascular anatomy. Carotid stenosis measurements (when applicable) are obtained utilizing NASCET criteria, using the distal internal carotid diameter as the denominator. Multiphase CT imaging of the brain was performed following IV bolus contrast injection. Subsequent parametric perfusion maps were calculated using RAPID software. CONTRAST:  183mL OMNIPAQUE IOHEXOL 350 MG/ML SOLN COMPARISON:  None. FINDINGS: CTA NECK Aortic arch: Mild plaque along the aortic arch and great vessel origins, which are patent. Right carotid system: Patent. Primarily calcified plaque at the ICA origin causing less than 50% stenosis. Left carotid system: Patent. Primarily calcified plaque at the ICA origin causing less than 50% stenosis. Vertebral arteries: Patent.  Left vertebral artery is dominant. Skeleton: Advanced multilevel degenerative changes of the cervical spine. Degenerative changes of the temporomandibular joints. Other neck: No neck mass. Right supraclavicular adenopathy identified on prior chest CT appears to be present but poorly evaluated due to artifact. Upper chest: Included upper lungs are clear. Review of the MIP images confirms the above findings CTA HEAD Anterior circulation: Intracranial internal carotid arteries patent with mild calcified plaque. Anterior and middle cerebral arteries are patent. Posterior circulation: Intracranial vertebral arteries are patent. Minimal calcified plaque on the left. Basilar artery is patent. Posterior cerebral arteries are patent. Venous sinuses: Patent as allowed by contrast bolus timing. No apparent enhancement in the region of bilateral frontal hypodense lesions. Review of the MIP images confirms the above findings CT Brain Perfusion Findings: CBF  (<30%) Volume: 76mL  Perfusion (Tmax>6.0s) volume: 84mL Mismatch Volume: 42mL Infarction Location: None IMPRESSION: No large vessel occlusion or hemodynamically significant stenosis. Perfusion imaging demonstrates no evidence of core infarction or penumbra. No apparent enhancement or definite hyperperfusion in the region of low-density lesions on head CT. Electronically Signed   By: Macy Mis M.D.   On: 07/31/2020 14:11   CT Code Stroke CTA Neck W/WO contrast  Result Date: 07/31/2020 CLINICAL DATA:  Right-sided weakness EXAM: CT ANGIOGRAPHY HEAD AND NECK CT PERFUSION BRAIN TECHNIQUE: Multidetector CT imaging of the head and neck was performed using the standard protocol during bolus administration of intravenous contrast. Multiplanar CT image reconstructions and MIPs were obtained to evaluate the vascular anatomy. Carotid stenosis measurements (when applicable) are obtained utilizing NASCET criteria, using the distal internal carotid diameter as the denominator. Multiphase CT imaging of the brain was performed following IV bolus contrast injection. Subsequent parametric perfusion maps were calculated using RAPID software. CONTRAST:  18mL OMNIPAQUE IOHEXOL 350 MG/ML SOLN COMPARISON:  None. FINDINGS: CTA NECK Aortic arch: Mild plaque along the aortic arch and great vessel origins, which are patent. Right carotid system: Patent. Primarily calcified plaque at the ICA origin causing less than 50% stenosis. Left carotid system: Patent. Primarily calcified plaque at the ICA origin causing less than 50% stenosis. Vertebral arteries: Patent.  Left vertebral artery is dominant. Skeleton: Advanced multilevel degenerative changes of the cervical spine. Degenerative changes of the temporomandibular joints. Other neck: No neck mass. Right supraclavicular adenopathy identified on prior chest CT appears to be present but poorly evaluated due to artifact. Upper chest: Included upper lungs are clear. Review of the MIP images  confirms the above findings CTA HEAD Anterior circulation: Intracranial internal carotid arteries patent with mild calcified plaque. Anterior and middle cerebral arteries are patent. Posterior circulation: Intracranial vertebral arteries are patent. Minimal calcified plaque on the left. Basilar artery is patent. Posterior cerebral arteries are patent. Venous sinuses: Patent as allowed by contrast bolus timing. No apparent enhancement in the region of bilateral frontal hypodense lesions. Review of the MIP images confirms the above findings CT Brain Perfusion Findings: CBF (<30%) Volume: 60mL Perfusion (Tmax>6.0s) volume: 47mL Mismatch Volume: 64mL Infarction Location: None IMPRESSION: No large vessel occlusion or hemodynamically significant stenosis. Perfusion imaging demonstrates no evidence of core infarction or penumbra. No apparent enhancement or definite hyperperfusion in the region of low-density lesions on head CT. Electronically Signed   By: Macy Mis M.D.   On: 07/31/2020 14:11   CT Code Stroke Cerebral Perfusion with contrast  Result Date: 07/31/2020 CLINICAL DATA:  Right-sided weakness EXAM: CT ANGIOGRAPHY HEAD AND NECK CT PERFUSION BRAIN TECHNIQUE: Multidetector CT imaging of the head and neck was performed using the standard protocol during bolus administration of intravenous contrast. Multiplanar CT image reconstructions and MIPs were obtained to evaluate the vascular anatomy. Carotid stenosis measurements (when applicable) are obtained utilizing NASCET criteria, using the distal internal carotid diameter as the denominator. Multiphase CT imaging of the brain was performed following IV bolus contrast injection. Subsequent parametric perfusion maps were calculated using RAPID software. CONTRAST:  166mL OMNIPAQUE IOHEXOL 350 MG/ML SOLN COMPARISON:  None. FINDINGS: CTA NECK Aortic arch: Mild plaque along the aortic arch and great vessel origins, which are patent. Right carotid system: Patent.  Primarily calcified plaque at the ICA origin causing less than 50% stenosis. Left carotid system: Patent. Primarily calcified plaque at the ICA origin causing less than 50% stenosis. Vertebral arteries: Patent.  Left vertebral artery is dominant. Skeleton: Advanced multilevel degenerative changes  of the cervical spine. Degenerative changes of the temporomandibular joints. Other neck: No neck mass. Right supraclavicular adenopathy identified on prior chest CT appears to be present but poorly evaluated due to artifact. Upper chest: Included upper lungs are clear. Review of the MIP images confirms the above findings CTA HEAD Anterior circulation: Intracranial internal carotid arteries patent with mild calcified plaque. Anterior and middle cerebral arteries are patent. Posterior circulation: Intracranial vertebral arteries are patent. Minimal calcified plaque on the left. Basilar artery is patent. Posterior cerebral arteries are patent. Venous sinuses: Patent as allowed by contrast bolus timing. No apparent enhancement in the region of bilateral frontal hypodense lesions. Review of the MIP images confirms the above findings CT Brain Perfusion Findings: CBF (<30%) Volume: 49mL Perfusion (Tmax>6.0s) volume: 51mL Mismatch Volume: 64mL Infarction Location: None IMPRESSION: No large vessel occlusion or hemodynamically significant stenosis. Perfusion imaging demonstrates no evidence of core infarction or penumbra. No apparent enhancement or definite hyperperfusion in the region of low-density lesions on head CT. Electronically Signed   By: Macy Mis M.D.   On: 07/31/2020 14:11   CT HEAD CODE STROKE WO CONTRAST  Result Date: 07/31/2020 CLINICAL DATA:  Code stroke.  Right-sided weakness EXAM: CT HEAD WITHOUT CONTRAST TECHNIQUE: Contiguous axial images were obtained from the base of the skull through the vertex without intravenous contrast. COMPARISON:  None. FINDINGS: Significant streak artifact through the skull base  and inferior posterior fossa due to dental amalgam. Brain: There is no acute intracranial hemorrhage. Low-density lesion of the left middle frontal gyrus measures up to 1.5 cm coronally. Additional right middle frontal gyrus low-density lesion measures about 9 mm. Question of small area of loss of gray-white differentiation at the junction of superior and precentral gyri (series 3, image 29). Prominence of the ventricles and sulci reflects generalized parenchymal volume loss. No extra-axial collection or hydrocephalus. Vascular: No hyperdense vessel. There is mild intracranial atherosclerotic calcification at the skull base. Skull: Unremarkable Sinuses/Orbits: Evidence of prior sinonasal surgery. Small right maxillary sinus air-fluid level. No acute abnormality of the orbits. Other: Mastoid air cells are clear. ASPECTS [facility] Stroke Program Early CT Score) - Ganglionic level infarction (caudate, lentiform nuclei, internal capsule, insula, M1-M3 cortex): 7 - Supraganglionic infarction (M4-M6 cortex): 2 Total score (0-10 with 10 being normal): 9 IMPRESSION: No acute intracranial hemorrhage. Possible small area of acute infarction at the junction of left superior and precentral gyri. However, this is contralateral to expected location for right-sided symptoms. Bilateral frontal hypodense lesions, which could reflect areas of encephalomalacia. However, there is no significant associated volume loss and metastatic disease is a consideration given apparent progression on recent cross-sectional imaging. These results were communicated to Dr. Leonel Ramsay at 1:41 pm on 07/31/2020 by text page via the [facility] messaging system. Electronically Signed   By: Macy Mis M.D.   On: 07/31/2020 14:13    Procedures Procedures (including critical care time) CRITICAL CARE Performed by: Charlesetta Shanks   Total critical care time: 20 minutes  Critical care time was exclusive of separately billable procedures and treating  other patients.  Critical care was necessary to treat or prevent imminent or life-threatening deterioration.  Critical care was time spent personally by me on the following activities: development of treatment plan with patient and/or surrogate as well as nursing, discussions with consultants, evaluation of patient's response to treatment, examination of patient, obtaining history from patient or surrogate, ordering and performing treatments and interventions, ordering and review of laboratory studies, ordering and review of radiographic studies, pulse  oximetry and re-evaluation of patient's condition. Medications Ordered in ED Medications  iohexol (OMNIPAQUE) 350 MG/ML injection 100 mL (has no administration in time range)  iohexol (OMNIPAQUE) 350 MG/ML injection 100 mL (100 mLs Intravenous Contrast Given 07/31/20 1338)    ED Course  I have reviewed the triage vital signs and the nursing notes.  Pertinent labs & imaging results that were available during my care of the patient were reviewed by me and considered in my medical decision making (see chart for details).    MDM Rules/Calculators/A&P                          Patient presents as outlined above.  Unclear exactly how long he was down.  At this time patient is alert and appropriate.  He arrives as code stroke.  Patient is not a TPA candidate.  No acute traumatic intracranial injuries identified.  Dr. Leonel Ramsay has reviewed results.  Still concern for possible metastatic etiology or other small stroke.  Plan will be for medical admission.  Patient has significant leukocytosis but no focus of infection at this time.  Blood pressure normotensive.  Patient is not febrile.  Will not opt for empiric antibiotics at this time.  We will continue further evaluation. Final Clinical Impression(s) / ED Diagnoses Final diagnoses:  Metastatic adenocarcinoma ([place])  Altered mental status, unspecified altered mental status type    Rx / DC  Orders ED Discharge Orders    None       Charlesetta Shanks, MD 07/31/20 1425

## 2020-07-31 NOTE — ED Notes (Signed)
EEG at bedside.

## 2020-07-31 NOTE — Procedures (Signed)
Patient Name: Jonathan Miranda  MRN: [id number]  Epilepsy Attending: Lora Havens  Referring Physician/Provider: Dr. Kathrynn Speed Date: 07/31/2020 Duration: 23.56 mins  Patient history: 69 year old male presented with sudden onset of right-sided weakness.  EEG done for seizures.  Level of alertness: Awake, asleep  AEDs during EEG study: None  Technical aspects: This EEG study was done with scalp electrodes positioned according to the 10-20 International system of electrode placement. Electrical activity was acquired at a sampling rate of 500Hz  and reviewed with a high frequency filter of 70Hz  and a low frequency filter of 1Hz . EEG data were recorded continuously and digitally stored.   Description: During awake state, no clear posterior dominant rhythm was seen.  Sleep was characterized by vertex waves, sleep spindles (12 to 14 Hz), maximal frontocentral region.  EEG showed continuous generalized at times rhythmic 3 to 6 Hz theta-delta slowing.  Hyperventilation and photic stimulation were not performed.     ABNORMALITY -Continuous slow, generalized  IMPRESSION: This study is suggestive of mild to moderate diffuse encephalopathy, nonspecific etiology.  No seizures or epileptiform discharges were seen throughout the recording.  [name] Barbra Sarks

## 2020-08-01 ENCOUNTER — Observation Stay (HOSPITAL_COMMUNITY): Payer: Medicare Other

## 2020-08-01 ENCOUNTER — Encounter (HOSPITAL_COMMUNITY): Payer: Self-pay | Admitting: Internal Medicine

## 2020-08-01 DIAGNOSIS — C7911 Secondary malignant neoplasm of bladder: Secondary | ICD-10-CM

## 2020-08-01 DIAGNOSIS — Z8249 Family history of ischemic heart disease and other diseases of the circulatory system: Secondary | ICD-10-CM | POA: Diagnosis not present

## 2020-08-01 DIAGNOSIS — R590 Localized enlarged lymph nodes: Secondary | ICD-10-CM | POA: Diagnosis present

## 2020-08-01 DIAGNOSIS — Z7189 Other specified counseling: Secondary | ICD-10-CM | POA: Diagnosis not present

## 2020-08-01 DIAGNOSIS — R29704 NIHSS score 4: Secondary | ICD-10-CM | POA: Diagnosis present

## 2020-08-01 DIAGNOSIS — I69351 Hemiplegia and hemiparesis following cerebral infarction affecting right dominant side: Secondary | ICD-10-CM | POA: Diagnosis not present

## 2020-08-01 DIAGNOSIS — I6389 Other cerebral infarction: Secondary | ICD-10-CM | POA: Diagnosis not present

## 2020-08-01 DIAGNOSIS — R64 Cachexia: Secondary | ICD-10-CM | POA: Diagnosis present

## 2020-08-01 DIAGNOSIS — C3490 Malignant neoplasm of unspecified part of unspecified bronchus or lung: Secondary | ICD-10-CM | POA: Diagnosis present

## 2020-08-01 DIAGNOSIS — I6340 Cerebral infarction due to embolism of unspecified cerebral artery: Secondary | ICD-10-CM | POA: Diagnosis present

## 2020-08-01 DIAGNOSIS — R29810 Facial weakness: Secondary | ICD-10-CM | POA: Diagnosis present

## 2020-08-01 DIAGNOSIS — W19XXXA Unspecified fall, initial encounter: Secondary | ICD-10-CM | POA: Diagnosis present

## 2020-08-01 DIAGNOSIS — E119 Type 2 diabetes mellitus without complications: Secondary | ICD-10-CM | POA: Diagnosis present

## 2020-08-01 DIAGNOSIS — E441 Mild protein-calorie malnutrition: Secondary | ICD-10-CM | POA: Diagnosis present

## 2020-08-01 DIAGNOSIS — Z66 Do not resuscitate: Secondary | ICD-10-CM | POA: Diagnosis present

## 2020-08-01 DIAGNOSIS — Z515 Encounter for palliative care: Secondary | ICD-10-CM | POA: Diagnosis not present

## 2020-08-01 DIAGNOSIS — I2699 Other pulmonary embolism without acute cor pulmonale: Secondary | ICD-10-CM | POA: Diagnosis present

## 2020-08-01 DIAGNOSIS — I350 Nonrheumatic aortic (valve) stenosis: Secondary | ICD-10-CM

## 2020-08-01 DIAGNOSIS — I639 Cerebral infarction, unspecified: Secondary | ICD-10-CM | POA: Diagnosis not present

## 2020-08-01 DIAGNOSIS — Z20822 Contact with and (suspected) exposure to covid-19: Secondary | ICD-10-CM | POA: Diagnosis present

## 2020-08-01 DIAGNOSIS — R4182 Altered mental status, unspecified: Secondary | ICD-10-CM | POA: Diagnosis not present

## 2020-08-01 DIAGNOSIS — C679 Malignant neoplasm of bladder, unspecified: Secondary | ICD-10-CM | POA: Diagnosis present

## 2020-08-01 DIAGNOSIS — Z87442 Personal history of urinary calculi: Secondary | ICD-10-CM | POA: Diagnosis not present

## 2020-08-01 DIAGNOSIS — J309 Allergic rhinitis, unspecified: Secondary | ICD-10-CM | POA: Diagnosis present

## 2020-08-01 DIAGNOSIS — R531 Weakness: Secondary | ICD-10-CM | POA: Diagnosis present

## 2020-08-01 DIAGNOSIS — Z833 Family history of diabetes mellitus: Secondary | ICD-10-CM | POA: Diagnosis not present

## 2020-08-01 DIAGNOSIS — I313 Pericardial effusion (noninflammatory): Secondary | ICD-10-CM | POA: Diagnosis present

## 2020-08-01 DIAGNOSIS — I10 Essential (primary) hypertension: Secondary | ICD-10-CM | POA: Diagnosis present

## 2020-08-01 DIAGNOSIS — Z8719 Personal history of other diseases of the digestive system: Secondary | ICD-10-CM | POA: Diagnosis not present

## 2020-08-01 DIAGNOSIS — Z681 Body mass index (BMI) 19 or less, adult: Secondary | ICD-10-CM | POA: Diagnosis not present

## 2020-08-01 DIAGNOSIS — C61 Malignant neoplasm of prostate: Secondary | ICD-10-CM

## 2020-08-01 DIAGNOSIS — E782 Mixed hyperlipidemia: Secondary | ICD-10-CM | POA: Diagnosis present

## 2020-08-01 LAB — ECHOCARDIOGRAM COMPLETE
AR max vel: 0.99 cm2
AV Area VTI: 1.15 cm2
AV Area mean vel: 1.06 cm2
AV Mean grad: 22 mmHg
AV Peak grad: 35.9 mmHg
Ao pk vel: 3 m/s
P 1/2 time: 224 msec
S' Lateral: 3 cm
Weight: 1650.8 oz

## 2020-08-01 LAB — GLUCOSE, CAPILLARY
Glucose-Capillary: 132 mg/dL — ABNORMAL HIGH (ref 70–99)
Glucose-Capillary: 143 mg/dL — ABNORMAL HIGH (ref 70–99)
Glucose-Capillary: 208 mg/dL — ABNORMAL HIGH (ref 70–99)
Glucose-Capillary: 246 mg/dL — ABNORMAL HIGH (ref 70–99)
Glucose-Capillary: 211 mg/dL — ABNORMAL HIGH (ref 70–99)

## 2020-08-01 LAB — LIPID PANEL
Cholesterol: 235 mg/dL — ABNORMAL HIGH (ref 0–200)
HDL: 36 mg/dL — ABNORMAL LOW (ref >40)
LDL Cholesterol: 151 mg/dL — ABNORMAL HIGH (ref 0–99)
Total CHOL/HDL Ratio: 6.5 RATIO
Triglycerides: 241 mg/dL — ABNORMAL HIGH (ref <150)
VLDL: 48 mg/dL — ABNORMAL HIGH (ref 0–40)

## 2020-08-01 LAB — HEMOGLOBIN A1C
Hgb A1c MFr Bld: 8.1 % — ABNORMAL HIGH (ref 4.8–5.6)
Mean Plasma Glucose: 186 mg/dL

## 2020-08-01 MED ORDER — METOPROLOL TARTRATE 12.5 MG HALF TABLET
12.5000 mg | ORAL_TABLET | Freq: Two times a day (BID) | ORAL | Status: DC
  Administered 2020-08-01 – 2020-08-06 (×11): 12.5 mg via ORAL
  Filled 2020-08-01 (×11): qty 1

## 2020-08-01 MED ORDER — PREDNISONE 50 MG PO TABS
50.0000 mg | ORAL_TABLET | Freq: Four times a day (QID) | ORAL | Status: AC
  Administered 2020-08-01 – 2020-08-02 (×3): 50 mg via ORAL
  Filled 2020-08-01 (×3): qty 1

## 2020-08-01 MED ORDER — DIPHENHYDRAMINE HCL 25 MG PO CAPS
50.0000 mg | ORAL_CAPSULE | Freq: Once | ORAL | Status: AC
  Administered 2020-08-02: 50 mg via ORAL
  Filled 2020-08-01: qty 2

## 2020-08-01 MED ORDER — SODIUM CHLORIDE 0.9 % IV SOLN: INTRAVENOUS | Status: DC

## 2020-08-01 MED ORDER — HEPARIN (PORCINE) 25000 UT/250ML-% IV SOLN
700.0000 [IU]/h | INTRAVENOUS | Status: AC
  Administered 2020-08-01: 600 [IU]/h via INTRAVENOUS
  Filled 2020-08-01: qty 250

## 2020-08-01 NOTE — Progress Notes (Signed)
ANTICOAGULATION CONSULT NOTE - Initial Consult  Pharmacy Consult for Heparin Indication: pulmonary embolus   Allergies  Allergen Reactions  . Zolpidem Anaphylaxis    Mother and sister had hallucinations to Ambien. Patient will decline taking this due family history.   . Statins Hives  . Iodine Hives  . Penicillins Hives  . Shellfish Allergy Hives    Patient Measurements: Height: 5\' 7"  (170.2 cm) Weight: 46.8 kg (103 lb 2.8 oz) IBW/kg (Calculated) : 66.1 Heparin Dosing Weight: 46.8 kg  Vital Signs: Temp: 98 F (36.7 C) (10/28 1135) Temp Source: Oral (10/28 1135) BP: 127/66 (10/28 1300) Pulse Rate: 106 (10/28 1300)  Labs: Recent Labs    07/31/20 1323 07/31/20 1327  HGB 11.3* 11.6*  HCT 34.3* 34.0*  PLT 294  --   APTT 27  --   LABPROT 17.2*  --   INR 1.5*  --   CREATININE 1.38* 1.20    Estimated Creatinine Clearance: 37.4 mL/min (by C-G formula based on SCr of 1.2 mg/dL).   Medical History: Past Medical History:  Diagnosis Date  . Allergic rhinitis   . Aortic stenosis, mild 11/28/2015  . Back pain   . Cancer ([place])   . Hep A w/o coma   . Hyperlipidemia   . Hypertension   . Nephrolithiasis   . Personal history of colonic polyps   . Prostatitis   . Rash   . Tinnitus    Assessment:  69 yr old male with hx PE 07/19/20 to begin IV heparin.  New CVA on admit 10/26.  Outpatient notes indicate that he was given Xarelto starter pack on 07/19/20.  Upon prompting, patient reports that he forgot to mention that medication on admit, but has been taking it.  Last dose reported 07/30/20, unknown time.    Has been on SQ heparin > last dose 5am today.   Plan no heparin boluses with new CVA, and low therapeutic goals.  Since recent Xarelto may falsely elevate heparin levels, will check aPTTs and heparin levels until they correlate.   Goal of Therapy:  Heparin level 0.3-0.5 units/ml aPTT 66-85 seconds Monitor platelets by anticoagulation protocol: Yes   Plan:   SQ  heparin discontinued.  Heparin drip to begin at 600 units/hr (~13 units/kg/hr)  Heparin level and aPTT ~8 hrs after drip begins.  Daily heparin level and aPTT until correlating; daily CBC.  Xarelto on hold.  Arty Baumgartner, [place] Phone: [phone] 08/01/2020,1:34 PM

## 2020-08-01 NOTE — Progress Notes (Signed)
Called to the room by the NT to find that the patient is confused and removed his IV with his Hep gtt infusing. He didn't notice the bleeding on his sheets and reports not knowing he removed his IV. This was his only IV.  MD notified.  Will replace IV after patient returns from head CT and restart Hep gtt.  Patient's mentation is fluctuating throughout the shift.  Will continue to monitor.

## 2020-08-01 NOTE — Progress Notes (Signed)
PROGRESS NOTE    Jonathan Miranda  JIR:[id number] DOB: 1951/07/20 DOA: 07/31/2020 PCP: Biagio Borg, MD   Brief Narrative:  69 y.o. male with medical history significant of hypertension, hyperlipidemia, metastatic adenocarcinoma of the lung and bladder cancer seen in the emergency room for encephalopathy.  The symptoms were very nonspecific.  EEG was unremarkable, CT head was negative but MRI showed multifocal CVA.  Neurology team was consulted.   Assessment & Plan:   Principal Problem:   AMS (altered mental status) Active Problems:   HYPERLIPIDEMIA, MIXED   Essential hypertension   Diabetes ([name])  Acute multifocal CVA -Either secondary to A. fib or hypercoagulable state from underlying malignancy. -CT head negative but MRI confirms CVA -Echocardiogram performed-results pending -LDL-151, A1c-pending -Neurology consulted.  Permissive hypertension -Antiplatelet vs Anticoagulation.   Nonobstructive bilateral pulmonary embolism, diagnosed 07/19/2020 Mediastinal lymphadenopathy -Recent diagnosis, on outpatient Eliquis?  Stop subcu heparin, start heparin drip  Sinus tachycardia -EKG-negative for A. fib which showed sinus tachycardia -Start metoprolol 25 mg twice daily. -We will likely need loop recorder  Essential hypertension -Permissive hypertension  Leukocytosis, chronic -Secondary to underlying malignancy and prednisone use.  Diabetes mellitus type 2 -Likely induced from steroids.  Insulin sliding/Accu-Chek.  Hyperlipidemia -On Zetia  High-grade invasive bladder cancer Metastatic adenocarcinoma of the lung -Follows at [facility].  Given acute CVA, pulmonary embolism and advanced malignancy-overall poor prognosis.  Lack of family support according to him at home.  We will consult palliative care.  DVT prophylaxis:  Heparin drip   Code Status: Full code Family Communication: None  Dispo: The patient is from: Home              Anticipated d/c is  to: SNF              Anticipated d/c date is: 1 day              Patient currently is not medically stable to d/c.  Ongoing work-up for acute CVA.       Body mass index is 16.16 kg/m.      Subjective: Patient seen and examined at bedside, he is working with speech therapist.  And does not have any complaints.  Tells me he lives alone any drives normally.  Review of Systems Otherwise negative except as per HPI, including: General: Denies fever, chills, night sweats or unintended weight loss. Resp: Denies cough, wheezing, shortness of breath. Cardiac: Denies chest pain, palpitations, orthopnea, paroxysmal nocturnal dyspnea. GI: Denies abdominal pain, nausea, vomiting, diarrhea or constipation GU: Denies dysuria, frequency, hesitancy or incontinence MS: Denies muscle aches, joint pain or swelling Neuro: Denies headache, neurologic deficits (focal weakness, numbness, tingling), abnormal gait Psych: Denies anxiety, depression, SI/HI/AVH Skin: Denies new rashes or lesions ID: Denies sick contacts, exotic exposures, travel  Examination: Constitutional: Elderly frail Respiratory: Clear to auscultation bilaterally Cardiovascular: Normal sinus tachycardia, no rubs Abdomen: Nontender nondistended good bowel sounds Musculoskeletal: No edema noted Skin: No rashes seen Neurologic: CN 2-12 grossly intact.  And nonfocal Psychiatric: Alert awake oriented X2.  Normal judgment and insight Objective: Vitals:   08/01/20 0011 08/01/20 0237 08/01/20 0410 08/01/20 0745  BP: 140/64 138/85 126/63 110/78  Pulse: (!) 113 (!) 105 (!) 111 (!) 114  Resp:    13  Temp: 97.8 F (36.6 C) 97.8 F (36.6 C) 98 F (36.7 C) 98.2 F (36.8 C)  TempSrc: Oral  Oral Oral  SpO2: 100% 98% 100% 100%  Weight:        Intake/Output  Summary (Last 24 hours) at 08/01/2020 1056 Last data filed at 07/31/2020 1453 Gross per 24 hour  Intake --  Output 50 ml  Net -50 ml   Filed Weights   07/31/20 1355 07/31/20  2059  Weight: 50.3 kg 46.8 kg     Data Reviewed:   CBC: Recent Labs  Lab 07/31/20 1323 07/31/20 1327  WBC 23.6*  --   NEUTROABS 18.5*  --   HGB 11.3* 11.6*  HCT 34.3* 34.0*  MCV 87.3  --   PLT 294  --    Basic Metabolic Panel: Recent Labs  Lab 07/31/20 1323 07/31/20 1327  NA 137 136  K 4.4 4.6  CL 102 103  CO2 19*  --   GLUCOSE 235* 235*  BUN 31* 39*  CREATININE 1.38* 1.20  CALCIUM 9.5  --    GFR: CrCl cannot be calculated (Unknown ideal weight.). Liver Function Tests: Recent Labs  Lab 07/31/20 1323  AST 36  ALT 20  ALKPHOS 176*  BILITOT 1.5*  PROT 6.0*  ALBUMIN 3.2*   No results for input(s): LIPASE, AMYLASE in the last 168 hours. No results for input(s): AMMONIA in the last 168 hours. Coagulation Profile: Recent Labs  Lab 07/31/20 1323  INR 1.5*   Cardiac Enzymes: No results for input(s): CKTOTAL, CKMB, CKMBINDEX, TROPONINI in the last 168 hours. BNP (last 3 results) No results for input(s): PROBNP in the last 8760 hours. HbA1C: No results for input(s): HGBA1C in the last 72 hours. CBG: Recent Labs  Lab 07/31/20 1320 07/31/20 2055 07/31/20 2323 08/01/20 0332 08/01/20 0817  GLUCAP 213* 217* 147* 132* 143*   Lipid Profile: Recent Labs    08/01/20 0428  CHOL 235*  HDL 36*  LDLCALC 151*  TRIG 241*  CHOLHDL 6.5   Thyroid Function Tests: No results for input(s): TSH, T4TOTAL, FREET4, T3FREE, THYROIDAB in the last 72 hours. Anemia Panel: No results for input(s): VITAMINB12, FOLATE, FERRITIN, TIBC, IRON, RETICCTPCT in the last 72 hours. Sepsis Labs: No results for input(s): PROCALCITON, LATICACIDVEN in the last 168 hours.  Recent Results (from the past 240 hour(s))  Respiratory Panel by RT PCR (Flu A&B, Covid) - Nasopharyngeal Swab     Status: None   Collection Time: 07/31/20  4:39 PM   Specimen: Nasopharyngeal Swab  Result Value Ref Range Status   SARS Coronavirus 2 by RT PCR NEGATIVE NEGATIVE Final    Comment: (NOTE) SARS-CoV-2  target nucleic acids are NOT DETECTED.  The SARS-CoV-2 RNA is generally detectable in upper respiratoy specimens during the acute phase of infection. The lowest concentration of SARS-CoV-2 viral copies this assay can detect is 131 copies/mL. A negative result does not preclude SARS-Cov-2 infection and should not be used as the sole basis for treatment or other patient management decisions. A negative result may occur with  improper specimen collection/handling, submission of specimen other than nasopharyngeal swab, presence of viral mutation(s) within the areas targeted by this assay, and inadequate number of viral copies (<131 copies/mL). A negative result must be combined with clinical observations, patient history, and epidemiological information. The expected result is Negative.  Fact Sheet for Patients:  PinkCheek.be  Fact Sheet for Healthcare Providers:  GravelBags.it  This test is no t yet approved or cleared by the [place] FDA and  has been authorized for detection and/or diagnosis of SARS-CoV-2 by FDA under an Emergency Use Authorization (EUA). This EUA will remain  in effect (meaning this test can be used) for the duration of the COVID-19  declaration under Section 564(b)(1) of the Act, 21 U.S.C. section 360bbb-3(b)(1), unless the authorization is terminated or revoked sooner.     Influenza A by PCR NEGATIVE NEGATIVE Final   Influenza B by PCR NEGATIVE NEGATIVE Final    Comment: (NOTE) The Xpert Xpress SARS-CoV-2/FLU/RSV assay is intended as an aid in  the diagnosis of influenza from Nasopharyngeal swab specimens and  should not be used as a sole basis for treatment. Nasal washings and  aspirates are unacceptable for Xpert Xpress SARS-CoV-2/FLU/RSV  testing.  Fact Sheet for Patients: PinkCheek.be  Fact Sheet for Healthcare  Providers: GravelBags.it  This test is not yet approved or cleared by the [place] FDA and  has been authorized for detection and/or diagnosis of SARS-CoV-2 by  FDA under an Emergency Use Authorization (EUA). This EUA will remain  in effect (meaning this test can be used) for the duration of the  Covid-19 declaration under Section 564(b)(1) of the Act, 21  U.S.C. section 360bbb-3(b)(1), unless the authorization is  terminated or revoked. Performed at [facility], [place] [address] [place], [place] [address]          Radiology Studies: CT Code Stroke CTA Head W/WO contrast  Result Date: 07/31/2020 CLINICAL DATA:  Right-sided weakness EXAM: CT ANGIOGRAPHY HEAD AND NECK CT PERFUSION BRAIN TECHNIQUE: Multidetector CT imaging of the head and neck was performed using the standard protocol during bolus administration of intravenous contrast. Multiplanar CT image reconstructions and MIPs were obtained to evaluate the vascular anatomy. Carotid stenosis measurements (when applicable) are obtained utilizing NASCET criteria, using the distal internal carotid diameter as the denominator. Multiphase CT imaging of the brain was performed following IV bolus contrast injection. Subsequent parametric perfusion maps were calculated using RAPID software. CONTRAST:  155mL OMNIPAQUE IOHEXOL 350 MG/ML SOLN COMPARISON:  None. FINDINGS: CTA NECK Aortic arch: Mild plaque along the aortic arch and great vessel origins, which are patent. Right carotid system: Patent. Primarily calcified plaque at the ICA origin causing less than 50% stenosis. Left carotid system: Patent. Primarily calcified plaque at the ICA origin causing less than 50% stenosis. Vertebral arteries: Patent.  Left vertebral artery is dominant. Skeleton: Advanced multilevel degenerative changes of the cervical spine. Degenerative changes of the temporomandibular joints. Other neck: No neck mass. Right supraclavicular  adenopathy identified on prior chest CT appears to be present but poorly evaluated due to artifact. Upper chest: Included upper lungs are clear. Review of the MIP images confirms the above findings CTA HEAD Anterior circulation: Intracranial internal carotid arteries patent with mild calcified plaque. Anterior and middle cerebral arteries are patent. Posterior circulation: Intracranial vertebral arteries are patent. Minimal calcified plaque on the left. Basilar artery is patent. Posterior cerebral arteries are patent. Venous sinuses: Patent as allowed by contrast bolus timing. No apparent enhancement in the region of bilateral frontal hypodense lesions. Review of the MIP images confirms the above findings CT Brain Perfusion Findings: CBF (<30%) Volume: 57mL Perfusion (Tmax>6.0s) volume: 31mL Mismatch Volume: 26mL Infarction Location: None IMPRESSION: No large vessel occlusion or hemodynamically significant stenosis. Perfusion imaging demonstrates no evidence of core infarction or penumbra. No apparent enhancement or definite hyperperfusion in the region of low-density lesions on head CT. Electronically Signed   By: Macy Mis M.D.   On: 07/31/2020 14:11   CT Code Stroke CTA Neck W/WO contrast  Result Date: 07/31/2020 CLINICAL DATA:  Right-sided weakness EXAM: CT ANGIOGRAPHY HEAD AND NECK CT PERFUSION BRAIN TECHNIQUE: Multidetector CT imaging of the head and neck was  performed using the standard protocol during bolus administration of intravenous contrast. Multiplanar CT image reconstructions and MIPs were obtained to evaluate the vascular anatomy. Carotid stenosis measurements (when applicable) are obtained utilizing NASCET criteria, using the distal internal carotid diameter as the denominator. Multiphase CT imaging of the brain was performed following IV bolus contrast injection. Subsequent parametric perfusion maps were calculated using RAPID software. CONTRAST:  11mL OMNIPAQUE IOHEXOL 350 MG/ML SOLN  COMPARISON:  None. FINDINGS: CTA NECK Aortic arch: Mild plaque along the aortic arch and great vessel origins, which are patent. Right carotid system: Patent. Primarily calcified plaque at the ICA origin causing less than 50% stenosis. Left carotid system: Patent. Primarily calcified plaque at the ICA origin causing less than 50% stenosis. Vertebral arteries: Patent.  Left vertebral artery is dominant. Skeleton: Advanced multilevel degenerative changes of the cervical spine. Degenerative changes of the temporomandibular joints. Other neck: No neck mass. Right supraclavicular adenopathy identified on prior chest CT appears to be present but poorly evaluated due to artifact. Upper chest: Included upper lungs are clear. Review of the MIP images confirms the above findings CTA HEAD Anterior circulation: Intracranial internal carotid arteries patent with mild calcified plaque. Anterior and middle cerebral arteries are patent. Posterior circulation: Intracranial vertebral arteries are patent. Minimal calcified plaque on the left. Basilar artery is patent. Posterior cerebral arteries are patent. Venous sinuses: Patent as allowed by contrast bolus timing. No apparent enhancement in the region of bilateral frontal hypodense lesions. Review of the MIP images confirms the above findings CT Brain Perfusion Findings: CBF (<30%) Volume: 3mL Perfusion (Tmax>6.0s) volume: 88mL Mismatch Volume: 94mL Infarction Location: None IMPRESSION: No large vessel occlusion or hemodynamically significant stenosis. Perfusion imaging demonstrates no evidence of core infarction or penumbra. No apparent enhancement or definite hyperperfusion in the region of low-density lesions on head CT. Electronically Signed   By: Macy Mis M.D.   On: 07/31/2020 14:11   MR BRAIN WO CONTRAST  Result Date: 07/31/2020 CLINICAL DATA:  Code stroke follow-up EXAM: MRI HEAD WITHOUT CONTRAST TECHNIQUE: Multiplanar, multiecho pulse sequences of the brain and  surrounding structures were obtained without intravenous contrast. COMPARISON:  None. FINDINGS: Diffusion-weighted imaging was obtained. Patient could not tolerate remainder of study. Significant motion artifact is present. There are numerous small foci of reduced diffusion involving bilateral frontoparietal and occipital lobes as well as the cerebellum. IMPRESSION: Single sequence study with significant motion degradation. Multiple small acute infarcts involving bilateral vascular territories. Electronically Signed   By: Macy Mis M.D.   On: 07/31/2020 19:13   CT C-SPINE NO CHARGE  Result Date: 07/31/2020 CLINICAL DATA:  69 year old male with fall and neck trauma. EXAM: CT CERVICAL SPINE WITHOUT CONTRAST TECHNIQUE: Multidetector CT imaging of the cervical spine was performed without intravenous contrast. Multiplanar CT image reconstructions were also generated. COMPARISON:  CT angiography dated 07/31/2020. FINDINGS: Alignment: Acute subluxation. Grade 1 C4-C5 anterolisthesis. Skull base and vertebrae: No acute fracture. Soft tissues and spinal canal: No prevertebral fluid or swelling. No visible canal hematoma. Disc levels: Multilevel degenerative changes with endplate irregularity and disc space narrowing and spurring. Multilevel facet arthropathy. Upper chest: Negative. Other: A subcentimeter right thyroid hypodense nodule. Not clinically significant; no follow-up imaging recommended (ref: J Am Coll Radiol. 2015 Feb;12(2): 143-50). Bilateral carotid bulb calcified plaques with IMPRESSION: 1. No acute/traumatic cervical spine pathology. 2. Multilevel degenerative changes. Electronically Signed   By: Anner Crete M.D.   On: 07/31/2020 16:13   CT Code Stroke Cerebral Perfusion with contrast  Result Date:  07/31/2020 CLINICAL DATA:  Right-sided weakness EXAM: CT ANGIOGRAPHY HEAD AND NECK CT PERFUSION BRAIN TECHNIQUE: Multidetector CT imaging of the head and neck was performed using the standard  protocol during bolus administration of intravenous contrast. Multiplanar CT image reconstructions and MIPs were obtained to evaluate the vascular anatomy. Carotid stenosis measurements (when applicable) are obtained utilizing NASCET criteria, using the distal internal carotid diameter as the denominator. Multiphase CT imaging of the brain was performed following IV bolus contrast injection. Subsequent parametric perfusion maps were calculated using RAPID software. CONTRAST:  151mL OMNIPAQUE IOHEXOL 350 MG/ML SOLN COMPARISON:  None. FINDINGS: CTA NECK Aortic arch: Mild plaque along the aortic arch and great vessel origins, which are patent. Right carotid system: Patent. Primarily calcified plaque at the ICA origin causing less than 50% stenosis. Left carotid system: Patent. Primarily calcified plaque at the ICA origin causing less than 50% stenosis. Vertebral arteries: Patent.  Left vertebral artery is dominant. Skeleton: Advanced multilevel degenerative changes of the cervical spine. Degenerative changes of the temporomandibular joints. Other neck: No neck mass. Right supraclavicular adenopathy identified on prior chest CT appears to be present but poorly evaluated due to artifact. Upper chest: Included upper lungs are clear. Review of the MIP images confirms the above findings CTA HEAD Anterior circulation: Intracranial internal carotid arteries patent with mild calcified plaque. Anterior and middle cerebral arteries are patent. Posterior circulation: Intracranial vertebral arteries are patent. Minimal calcified plaque on the left. Basilar artery is patent. Posterior cerebral arteries are patent. Venous sinuses: Patent as allowed by contrast bolus timing. No apparent enhancement in the region of bilateral frontal hypodense lesions. Review of the MIP images confirms the above findings CT Brain Perfusion Findings: CBF (<30%) Volume: 63mL Perfusion (Tmax>6.0s) volume: 63mL Mismatch Volume: 68mL Infarction Location: None  IMPRESSION: No large vessel occlusion or hemodynamically significant stenosis. Perfusion imaging demonstrates no evidence of core infarction or penumbra. No apparent enhancement or definite hyperperfusion in the region of low-density lesions on head CT. Electronically Signed   By: Macy Mis M.D.   On: 07/31/2020 14:11   EEG adult  Result Date: 07/31/2020 Lora Havens, MD     07/31/2020  3:29 PM Patient Name: DAVIEL ALLEGRETTO MRN: [id number] Epilepsy Attending: Lora Havens Referring Physician/Provider: Dr. Kathrynn Speed Date: 07/31/2020 Duration: 23.56 mins Patient history: 69 year old male presented with sudden onset of right-sided weakness.  EEG done for seizures. Level of alertness: Awake, asleep AEDs during EEG study: None Technical aspects: This EEG study was done with scalp electrodes positioned according to the 10-20 International system of electrode placement. Electrical activity was acquired at a sampling rate of 500Hz  and reviewed with a high frequency filter of 70Hz  and a low frequency filter of 1Hz . EEG data were recorded continuously and digitally stored. Description: During awake state, no clear posterior dominant rhythm was seen.  Sleep was characterized by vertex waves, sleep spindles (12 to 14 Hz), maximal frontocentral region.  EEG showed continuous generalized at times rhythmic 3 to 6 Hz theta-delta slowing.  Hyperventilation and photic stimulation were not performed.   ABNORMALITY -Continuous slow, generalized IMPRESSION: This study is suggestive of mild to moderate diffuse encephalopathy, nonspecific etiology.  No seizures or epileptiform discharges were seen throughout the recording. Lora Havens   CT HEAD CODE STROKE WO CONTRAST  Addendum Date: 07/31/2020   ADDENDUM REPORT: 07/31/2020 19:46 ADDENDUM: Dictation error in second point of impression. Possible acute infarct at junction of left superior and precentral gyri is consistent with right-sided symptoms.  Electronically Signed   By: Macy Mis M.D.   On: 07/31/2020 19:46   Result Date: 07/31/2020 CLINICAL DATA:  Code stroke.  Right-sided weakness EXAM: CT HEAD WITHOUT CONTRAST TECHNIQUE: Contiguous axial images were obtained from the base of the skull through the vertex without intravenous contrast. COMPARISON:  None. FINDINGS: Significant streak artifact through the skull base and inferior posterior fossa due to dental amalgam. Brain: There is no acute intracranial hemorrhage. Low-density lesion of the left middle frontal gyrus measures up to 1.5 cm coronally. Additional right middle frontal gyrus low-density lesion measures about 9 mm. Question of small area of loss of gray-white differentiation at the junction of superior and precentral gyri (series 3, image 29). Prominence of the ventricles and sulci reflects generalized parenchymal volume loss. No extra-axial collection or hydrocephalus. Vascular: No hyperdense vessel. There is mild intracranial atherosclerotic calcification at the skull base. Skull: Unremarkable Sinuses/Orbits: Evidence of prior sinonasal surgery. Small right maxillary sinus air-fluid level. No acute abnormality of the orbits. Other: Mastoid air cells are clear. ASPECTS [facility] Stroke Program Early CT Score) - Ganglionic level infarction (caudate, lentiform nuclei, internal capsule, insula, M1-M3 cortex): 7 - Supraganglionic infarction (M4-M6 cortex): 2 Total score (0-10 with 10 being normal): 9 IMPRESSION: No acute intracranial hemorrhage. Possible small area of acute infarction at the junction of left superior and precentral gyri. However, this is contralateral to expected location for right-sided symptoms. Bilateral frontal hypodense lesions, which could reflect areas of encephalomalacia. However, there is no significant associated volume loss and metastatic disease is a consideration given apparent progression on recent cross-sectional imaging. These results were communicated to Dr.  Leonel Ramsay at 1:41 pm on 07/31/2020 by text page via the [facility] messaging system. Electronically Signed: By: Macy Mis M.D. On: 07/31/2020 14:13        Scheduled Meds: .  stroke: mapping our early stages of recovery book   Does not apply Once  . aspirin  300 mg Rectal Daily   Or  . aspirin  325 mg Oral Daily  . ezetimibe  10 mg Oral Daily  . fluticasone  2 spray Each Nare Daily  . heparin  5,000 Units Subcutaneous Q8H  . insulin aspart  0-9 Units Subcutaneous Q4H  . thiamine injection  100 mg Intravenous Daily   Continuous Infusions: . sodium chloride       LOS: 0 days   Time spent= 35 mins    Garrett Bowring Arsenio Loader, MD [facility]  If 7PM-7AM, please contact night-coverage  08/01/2020, 10:56 AM

## 2020-08-01 NOTE — Plan of Care (Signed)
°  Problem: Education: Goal: Knowledge of disease or condition will improve Outcome: Progressing Goal: Knowledge of secondary prevention will improve Outcome: Progressing Goal: Knowledge of patient specific risk factors addressed and post discharge goals established will improve Outcome: Progressing Goal: Individualized Educational Video(s) Outcome: Progressing   Problem: Coping: Goal: Will verbalize positive feelings about self Outcome: Progressing Goal: Will identify appropriate support needs Outcome: Progressing   Problem: Health Behavior/Discharge Planning: Goal: Ability to manage health-related needs will improve Outcome: Progressing

## 2020-08-01 NOTE — Progress Notes (Signed)
STROKE TEAM PROGRESS NOTE   INTERVAL HISTORY I personally reviewed history of presenting illness, electronic medical records and imaging films in PACS.  Presented with right-sided weakness he states this morning is slightly improved.  He was not able to complete the MRI but only diffusion-weighted imaging was obtained which showed by cerebral embolic infarcts.  CT angiogram of brain and neck showed no significant large vessel stenosis or occlusion.  Echocardiogram is pending.  LDL cholesterol is 151 mg percent.  Vitals:   08/01/20 0237 08/01/20 0410 08/01/20 0745 08/01/20 1135  BP: 138/85 126/63 110/78 138/74  Pulse: (!) 105 (!) 111 (!) 114 (!) 106  Resp:   13 16  Temp: 97.8 F (36.6 C) 98 F (36.7 C) 98.2 F (36.8 C) 98 F (36.7 C)  TempSrc:  Oral Oral Oral  SpO2: 98% 100% 100% 100%  Weight:       CBC:  Recent Labs  Lab 07/31/20 1323 07/31/20 1327  WBC 23.6*  --   NEUTROABS 18.5*  --   HGB 11.3* 11.6*  HCT 34.3* 34.0*  MCV 87.3  --   PLT 294  --    Basic Metabolic Panel:  Recent Labs  Lab 07/31/20 1323 07/31/20 1327  NA 137 136  K 4.4 4.6  CL 102 103  CO2 19*  --   GLUCOSE 235* 235*  BUN 31* 39*  CREATININE 1.38* 1.20  CALCIUM 9.5  --    Lipid Panel:  Recent Labs  Lab 08/01/20 0428  CHOL 235*  TRIG 241*  HDL 36*  CHOLHDL 6.5  VLDL 48*  LDLCALC 151*   HgbA1c: No results for input(s): HGBA1C in the last 168 hours. Urine Drug Screen:  Recent Labs  Lab 07/31/20 1459  LABOPIA NONE DETECTED  COCAINSCRNUR NONE DETECTED  LABBENZ NONE DETECTED  AMPHETMU NONE DETECTED  THCU POSITIVE*  LABBARB NONE DETECTED    Alcohol Level  Recent Labs  Lab 07/31/20 1323  ETH <10    IMAGING past 24 hours CT Code Stroke CTA Head W/WO contrast  Result Date: 07/31/2020 CLINICAL DATA:  Right-sided weakness EXAM: CT ANGIOGRAPHY HEAD AND NECK CT PERFUSION BRAIN TECHNIQUE: Multidetector CT imaging of the head and neck was performed using the standard protocol during  bolus administration of intravenous contrast. Multiplanar CT image reconstructions and MIPs were obtained to evaluate the vascular anatomy. Carotid stenosis measurements (when applicable) are obtained utilizing NASCET criteria, using the distal internal carotid diameter as the denominator. Multiphase CT imaging of the brain was performed following IV bolus contrast injection. Subsequent parametric perfusion maps were calculated using RAPID software. CONTRAST:  185mL OMNIPAQUE IOHEXOL 350 MG/ML SOLN COMPARISON:  None. FINDINGS: CTA NECK Aortic arch: Mild plaque along the aortic arch and great vessel origins, which are patent. Right carotid system: Patent. Primarily calcified plaque at the ICA origin causing less than 50% stenosis. Left carotid system: Patent. Primarily calcified plaque at the ICA origin causing less than 50% stenosis. Vertebral arteries: Patent.  Left vertebral artery is dominant. Skeleton: Advanced multilevel degenerative changes of the cervical spine. Degenerative changes of the temporomandibular joints. Other neck: No neck mass. Right supraclavicular adenopathy identified on prior chest CT appears to be present but poorly evaluated due to artifact. Upper chest: Included upper lungs are clear. Review of the MIP images confirms the above findings CTA HEAD Anterior circulation: Intracranial internal carotid arteries patent with mild calcified plaque. Anterior and middle cerebral arteries are patent. Posterior circulation: Intracranial vertebral arteries are patent. Minimal calcified plaque on the  left. Basilar artery is patent. Posterior cerebral arteries are patent. Venous sinuses: Patent as allowed by contrast bolus timing. No apparent enhancement in the region of bilateral frontal hypodense lesions. Review of the MIP images confirms the above findings CT Brain Perfusion Findings: CBF (<30%) Volume: 90mL Perfusion (Tmax>6.0s) volume: 68mL Mismatch Volume: 19mL Infarction Location: None IMPRESSION: No  large vessel occlusion or hemodynamically significant stenosis. Perfusion imaging demonstrates no evidence of core infarction or penumbra. No apparent enhancement or definite hyperperfusion in the region of low-density lesions on head CT. Electronically Signed   By: Jonathan Miranda M.D.   On: 07/31/2020 14:11   CT Code Stroke CTA Neck W/WO contrast  Result Date: 07/31/2020 CLINICAL DATA:  Right-sided weakness EXAM: CT ANGIOGRAPHY HEAD AND NECK CT PERFUSION BRAIN TECHNIQUE: Multidetector CT imaging of the head and neck was performed using the standard protocol during bolus administration of intravenous contrast. Multiplanar CT image reconstructions and MIPs were obtained to evaluate the vascular anatomy. Carotid stenosis measurements (when applicable) are obtained utilizing NASCET criteria, using the distal internal carotid diameter as the denominator. Multiphase CT imaging of the brain was performed following IV bolus contrast injection. Subsequent parametric perfusion maps were calculated using RAPID software. CONTRAST:  158mL OMNIPAQUE IOHEXOL 350 MG/ML SOLN COMPARISON:  None. FINDINGS: CTA NECK Aortic arch: Mild plaque along the aortic arch and great vessel origins, which are patent. Right carotid system: Patent. Primarily calcified plaque at the ICA origin causing less than 50% stenosis. Left carotid system: Patent. Primarily calcified plaque at the ICA origin causing less than 50% stenosis. Vertebral arteries: Patent.  Left vertebral artery is dominant. Skeleton: Advanced multilevel degenerative changes of the cervical spine. Degenerative changes of the temporomandibular joints. Other neck: No neck mass. Right supraclavicular adenopathy identified on prior chest CT appears to be present but poorly evaluated due to artifact. Upper chest: Included upper lungs are clear. Review of the MIP images confirms the above findings CTA HEAD Anterior circulation: Intracranial internal carotid arteries patent with mild  calcified plaque. Anterior and middle cerebral arteries are patent. Posterior circulation: Intracranial vertebral arteries are patent. Minimal calcified plaque on the left. Basilar artery is patent. Posterior cerebral arteries are patent. Venous sinuses: Patent as allowed by contrast bolus timing. No apparent enhancement in the region of bilateral frontal hypodense lesions. Review of the MIP images confirms the above findings CT Brain Perfusion Findings: CBF (<30%) Volume: 67mL Perfusion (Tmax>6.0s) volume: 19mL Mismatch Volume: 63mL Infarction Location: None IMPRESSION: No large vessel occlusion or hemodynamically significant stenosis. Perfusion imaging demonstrates no evidence of core infarction or penumbra. No apparent enhancement or definite hyperperfusion in the region of low-density lesions on head CT. Electronically Signed   By: Jonathan Miranda M.D.   On: 07/31/2020 14:11   MR BRAIN WO CONTRAST  Result Date: 07/31/2020 CLINICAL DATA:  Code stroke follow-up EXAM: MRI HEAD WITHOUT CONTRAST TECHNIQUE: Multiplanar, multiecho pulse sequences of the brain and surrounding structures were obtained without intravenous contrast. COMPARISON:  None. FINDINGS: Diffusion-weighted imaging was obtained. Patient could not tolerate remainder of study. Significant motion artifact is present. There are numerous small foci of reduced diffusion involving bilateral frontoparietal and occipital lobes as well as the cerebellum. IMPRESSION: Single sequence study with significant motion degradation. Multiple small acute infarcts involving bilateral vascular territories. Electronically Signed   By: Jonathan Miranda M.D.   On: 07/31/2020 19:13   CT C-SPINE NO CHARGE  Result Date: 07/31/2020 CLINICAL DATA:  69 year old male with fall and neck trauma. EXAM: CT CERVICAL SPINE  WITHOUT CONTRAST TECHNIQUE: Multidetector CT imaging of the cervical spine was performed without intravenous contrast. Multiplanar CT image reconstructions were  also generated. COMPARISON:  CT angiography dated 07/31/2020. FINDINGS: Alignment: Acute subluxation. Grade 1 C4-C5 anterolisthesis. Skull base and vertebrae: No acute fracture. Soft tissues and spinal canal: No prevertebral fluid or swelling. No visible canal hematoma. Disc levels: Multilevel degenerative changes with endplate irregularity and disc space narrowing and spurring. Multilevel facet arthropathy. Upper chest: Negative. Other: A subcentimeter right thyroid hypodense nodule. Not clinically significant; no follow-up imaging recommended (ref: J Am Coll Radiol. 2015 Feb;12(2): 143-50). Bilateral carotid bulb calcified plaques with IMPRESSION: 1. No acute/traumatic cervical spine pathology. 2. Multilevel degenerative changes. Electronically Signed   By: Jonathan Miranda M.D.   On: 07/31/2020 16:13   CT Code Stroke Cerebral Perfusion with contrast  Result Date: 07/31/2020 CLINICAL DATA:  Right-sided weakness EXAM: CT ANGIOGRAPHY HEAD AND NECK CT PERFUSION BRAIN TECHNIQUE: Multidetector CT imaging of the head and neck was performed using the standard protocol during bolus administration of intravenous contrast. Multiplanar CT image reconstructions and MIPs were obtained to evaluate the vascular anatomy. Carotid stenosis measurements (when applicable) are obtained utilizing NASCET criteria, using the distal internal carotid diameter as the denominator. Multiphase CT imaging of the brain was performed following IV bolus contrast injection. Subsequent parametric perfusion maps were calculated using RAPID software. CONTRAST:  119mL OMNIPAQUE IOHEXOL 350 MG/ML SOLN COMPARISON:  None. FINDINGS: CTA NECK Aortic arch: Mild plaque along the aortic arch and great vessel origins, which are patent. Right carotid system: Patent. Primarily calcified plaque at the ICA origin causing less than 50% stenosis. Left carotid system: Patent. Primarily calcified plaque at the ICA origin causing less than 50% stenosis. Vertebral  arteries: Patent.  Left vertebral artery is dominant. Skeleton: Advanced multilevel degenerative changes of the cervical spine. Degenerative changes of the temporomandibular joints. Other neck: No neck mass. Right supraclavicular adenopathy identified on prior chest CT appears to be present but poorly evaluated due to artifact. Upper chest: Included upper lungs are clear. Review of the MIP images confirms the above findings CTA HEAD Anterior circulation: Intracranial internal carotid arteries patent with mild calcified plaque. Anterior and middle cerebral arteries are patent. Posterior circulation: Intracranial vertebral arteries are patent. Minimal calcified plaque on the left. Basilar artery is patent. Posterior cerebral arteries are patent. Venous sinuses: Patent as allowed by contrast bolus timing. No apparent enhancement in the region of bilateral frontal hypodense lesions. Review of the MIP images confirms the above findings CT Brain Perfusion Findings: CBF (<30%) Volume: 28mL Perfusion (Tmax>6.0s) volume: 71mL Mismatch Volume: 56mL Infarction Location: None IMPRESSION: No large vessel occlusion or hemodynamically significant stenosis. Perfusion imaging demonstrates no evidence of core infarction or penumbra. No apparent enhancement or definite hyperperfusion in the region of low-density lesions on head CT. Electronically Signed   By: Jonathan Miranda M.D.   On: 07/31/2020 14:11   EEG adult  Result Date: 07/31/2020 Jonathan Havens, MD     07/31/2020  3:29 PM Patient Name: Jonathan Miranda MRN: [id number] Epilepsy Attending: Lora Miranda Referring Physician/Provider: Dr. Kathrynn Speed Date: 07/31/2020 Duration: 23.56 mins Patient history: 69 year old male presented with sudden onset of right-sided weakness.  EEG done for seizures. Level of alertness: Awake, asleep AEDs during EEG study: None Technical aspects: This EEG study was done with scalp electrodes positioned according to the 10-20  International system of electrode placement. Electrical activity was acquired at a sampling rate of 500Hz  and reviewed with a high frequency  filter of 70Hz  and a low frequency filter of 1Hz . EEG data were recorded continuously and digitally stored. Description: During awake state, no clear posterior dominant rhythm was seen.  Sleep was characterized by vertex waves, sleep spindles (12 to 14 Hz), maximal frontocentral region.  EEG showed continuous generalized at times rhythmic 3 to 6 Hz theta-delta slowing.  Hyperventilation and photic stimulation were not performed.   ABNORMALITY -Continuous slow, generalized IMPRESSION: This study is suggestive of mild to moderate diffuse encephalopathy, nonspecific etiology.  No seizures or epileptiform discharges were seen throughout the recording. Jonathan Miranda   CT HEAD CODE STROKE WO CONTRAST  Addendum Date: 07/31/2020   ADDENDUM REPORT: 07/31/2020 19:46 ADDENDUM: Dictation error in second point of impression. Possible acute infarct at junction of left superior and precentral gyri is consistent with right-sided symptoms. Electronically Signed   By: Jonathan Miranda M.D.   On: 07/31/2020 19:46   Result Date: 07/31/2020 CLINICAL DATA:  Code stroke.  Right-sided weakness EXAM: CT HEAD WITHOUT CONTRAST TECHNIQUE: Contiguous axial images were obtained from the base of the skull through the vertex without intravenous contrast. COMPARISON:  None. FINDINGS: Significant streak artifact through the skull base and inferior posterior fossa due to dental amalgam. Brain: There is no acute intracranial hemorrhage. Low-density lesion of the left middle frontal gyrus measures up to 1.5 cm coronally. Additional right middle frontal gyrus low-density lesion measures about 9 mm. Question of small area of loss of gray-white differentiation at the junction of superior and precentral gyri (series 3, image 29). Prominence of the ventricles and sulci reflects generalized parenchymal volume  loss. No extra-axial collection or hydrocephalus. Vascular: No hyperdense vessel. There is mild intracranial atherosclerotic calcification at the skull base. Skull: Unremarkable Sinuses/Orbits: Evidence of prior sinonasal surgery. Small right maxillary sinus air-fluid level. No acute abnormality of the orbits. Other: Mastoid air cells are clear. ASPECTS [facility] Stroke Program Early CT Score) - Ganglionic level infarction (caudate, lentiform nuclei, internal capsule, insula, M1-M3 cortex): 7 - Supraganglionic infarction (M4-M6 cortex): 2 Total score (0-10 with 10 being normal): 9 IMPRESSION: No acute intracranial hemorrhage. Possible small area of acute infarction at the junction of left superior and precentral gyri. However, this is contralateral to expected location for right-sided symptoms. Bilateral frontal hypodense lesions, which could reflect areas of encephalomalacia. However, there is no significant associated volume loss and metastatic disease is a consideration given apparent progression on recent cross-sectional imaging. These results were communicated to Dr. Leonel Miranda at 1:41 pm on 07/31/2020 by text page via the [facility] messaging system. Electronically Signed: By: Jonathan Miranda M.D. On: 07/31/2020 14:13    PHYSICAL EXAM Frail cachectic malnourished looking elderly Caucasian male not in distress. . Afebrile. Head is nontraumatic. Neck is supple without bruit.    Cardiac exam no murmur or gallop. Lungs are clear to auscultation. Distal pulses are well felt. Neurological Exam : He is awake alert oriented to time place and person.  Speech and language appear normal.  Right lower facial weakness.  Tongue midline.  Motor system exam shows right upper extremity plegia with barely grade 1/5 strength with hypotonia.  Right lower extremity strength is 4/5.  Normal strength on the left.  Sensation is diminished on the right hemibody.  Normal on the left.  Gait not tested.   ASSESSMENT/PLAN Mr. GAHEL SAFLEY is a 69 y.o. male with history of metastatic adenocarcinoma of the lung as well as highly invasive bladder cancer presenting following a fall with R sided weakness.  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 B frontal lesions w/ volume loss. ASPECTS 9.    CTA head & neck no LVO or significant stenosis. No enhancement.  CT perfusion no core or penumbra  MRI  Multiple small B infarcts  Repeat CT head w/ contrast to look for mets pending    2D Echo pending   LDL 151  HgbA1c pending   VTE prophylaxis - Heparin 5000 units sq tid   aspirin 81 mg daily prior to admission, now on aspirin 325 mg daily.    Therapy recommendations:  SNF  Disposition:  pending   Hypertension  Stable . Permissive hypertension (OK if < 220/120) but gradually normalize in 5-7 days . Long-term BP goal normotensive  Hyperlipidemia  Home meds:  zetia, resumed in hospital  Intolerant to statins  LDL 151, goal < 70  Diabetes, type II  HgbA1c pending, goal < 7.0  Hyperglycemia, ? R/t steroids  Other Stroke Risk Factors  Advanced age  ETOH use, alcohol level <10, advised to drink no more than 2 drink(s) a day  Substance abuse - UDS:  THC POSITIVE. Patient advised to stop using due to stroke risk.  Mild aortic stenosis   Other Active Problems  metastatic adenocarcinoma of the lung   highly invasive bladder cancer   Cachexia, Body mass index is 16.16 kg/m.   NS added at [facility] day # 0 Patient was unable to tolerate a complete MRI with and without contrast.  We will consider repeating CT scan with and without contrast to look for metastasis as patient has clearly stated he will not be cooperative for MRI.  Check echocardiogram.  IV hydration since he looks dehydrated.  Continue ongoing stroke work-up.  Continue aspirin  alone for now.  Greater than 50% time during this 35-minute visit was spent on counseling and coordination of care about his embolic stroke versus metastasis discussion with patient and Dr. Thressa Sheller, MD  To contact Stroke Continuity provider, please refer to [email or web address] After hours, contact General Neurology

## 2020-08-01 NOTE — Progress Notes (Addendum)
Brief Neurology Progress Note  The patient is undergoing evaluaiton for possible metastatic disease requiting repeat CT head with contrast. He reports hives with contrast dye and required premedication.  Brief chart review showed hives with iodine, PCN, shellfish.  Ordered: Prednisone 50mg  @ 13 hours, 7 hours, and 1 hour prior contrast adminstration. Diphenhydramine 50mg  @ 1 hour prior to contrast adminstration.   Lynnae Sandhoff, MD Page: [phone]

## 2020-08-01 NOTE — Evaluation (Addendum)
Speech Language Pathology Evaluation Patient Details Name: Jonathan Miranda MRN: [id number] DOB: 1951-01-29 Today's Date: 08/01/2020 Time: 2542-7062 SLP Time Calculation (min) (ACUTE ONLY): 21 min  Problem List:  Patient Active Problem List   Diagnosis Date Noted  . AMS (altered mental status) 07/31/2020  . Gross hematuria 05/04/2017  . Left ear hearing loss 11/04/2016  . Aortic stenosis, mild 11/28/2015  . Chronic prostatitis 11/01/2015  . Chronic hyponatremia 07/26/2014  . Diabetes ([place]) 07/19/2014  . Routine health maintenance 07/28/2011  . BACK PAIN 12/14/2008  . HYPERLIPIDEMIA, MIXED 05/06/2007  . TINNITUS NOS 05/06/2007  . Essential hypertension 05/06/2007  . ALLERGIC RHINITIS 05/06/2007  . COLONIC POLYPS, HX OF 05/06/2007  . NEPHROLITHIASIS, HX OF 05/06/2007   Past Medical History:  Past Medical History:  Diagnosis Date  . Allergic rhinitis   . Aortic stenosis, mild 11/28/2015  . Back pain   . Cancer ([place])   . Hep A w/o coma   . Hyperlipidemia   . Hypertension   . Nephrolithiasis   . Personal history of colonic polyps   . Prostatitis   . Rash   . Tinnitus    Past Surgical History:  Past Surgical History:  Procedure Laterality Date  . correction deviated septum    . ORIF wrist- '05    . ureteroscopyp     HPI:  Jonathan Miranda is a 69 y.o. male with medical history significant of hypertension, hyperlipidemia, metastatic adenocarcinoma of the lung and bladder cancer seen in the emergency room for encephalopathy.  Code stroke called on patient arrival. HPi is limited due to pt being confused, he states he fell does not know when and how and when he woke up ems was there.Pt lives alone and per edmd note pt  was found on the floor and was last seen normal by friend who takes care of him.  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  Pt's baseline functional status is unknown, and pt is a poor historian, but per chart review, he does have a friend who checks on him at home.  Pt was assessed using subtests of the COGNISTAT (see below for additional information).  Pt oriented to person and place.  Although he stated he was at [facility], he initially stated he was in [place], but with probing question he ammended answer to [place].  Pt is not oriented to time.  Pt's language is relatively in tact.  There were semantic paraphasias noted during confrontational task (e.g. "frog" for "octopus"), and nonspecific language noted intermittently during conversation, but he was able to participate in conversation with therapist sharing that he has a 99 month old puppy named Wynonia Lawman and enjoys doing cryptoquote puzzles. During picture description, pt failed to recognize safety concerns, but did correclty identify items in drawing.  Pt had very poor performance on comprehension task, but this may be due in part to patient becoming drowsy or possible poor visual discrimination of objects, rather than a true receptive language deficit.  Pt did not consistently attend to L side during picture description task or command following task.  Pt followed directions for body movement and did not have difficulty understanding directions for remaining tasks.  Pt exhibited a moderate memory impairment for delayed recall task, but benefited from cuing.  Pt also  exhibited mild impairment with reasoning task.  Pt denied that his performance today represented a change in functional status.  Suspect that pt is not fully aware of deficits and will likely need assistance following discharge.    SLP will with begin therapy to target above noted deficits and pt would likely benefit from continued therapy at next level of care.  COGNISTAT -  all subtests are within the average range, except where otherwise specified Orientation: 4/12, Severe impairment Attention: 8/8 Comprehension: 1/6, Severe impairment Repetition: 12/12 Naming: 5/8, Mild impairment Construction: Not assessed Memory: 6/12, Moderate impairment Calculations: 3/4 Similarities: 5/8 Judgement: 3/6, Mild impairment    SLP Assessment  SLP Recommendation/Assessment: Patient needs continued Speech [place] [facility] SLP Visit Diagnosis: Dysphagia, unspecified (R13.10);Cognitive communication deficit (R41.841)    Follow Up Recommendations   (Continue ST at next level of care)    Frequency and Duration min 2x/week  2 weeks      SLP Evaluation Cognition  Overall Cognitive Status: Impaired/Different from baseline Orientation Level: Oriented to person Attention: Focused Focused Attention: Appears intact Memory: Impaired Memory Impairment: Retrieval deficit;Storage deficit;Decreased short term memory Decreased Short Term Memory: Verbal basic Awareness: Impaired Problem Solving: Impaired Problem Solving Impairment: Verbal complex Executive Function: Reasoning Reasoning: Impaired       Comprehension  Auditory Comprehension Overall Auditory Comprehension: Impaired Commands: Impaired One Step Basic Commands: 0-24% accurate Conversation: Simple EffectiveTechniques: Repetition    Expression Expression Primary Mode of Expression: Verbal Verbal Expression Overall Verbal Expression: Impaired Repetition: No impairment Naming: Impairment Confrontation: Impaired Verbal Errors: Semantic paraphasias   Oral / Motor  Oral Motor/Sensory Function Overall Oral Motor/Sensory Function: Mild impairment Facial ROM: Within Functional Limits Facial Symmetry: Abnormal symmetry left Lingual ROM:  (Could not test; reduced protrusion) Lingual Symmetry: Within Functional Limits Lingual Strength: Reduced Velum: Within Functional Limits Mandible: Within  Functional Limits Motor Speech Overall Motor Speech: Appears within functional limits for tasks assessed Respiration: Within functional limits Resonance: Within functional limits Articulation: Within functional limitis Intelligibility: Intelligible   GO                    Celedonio Savage, [place], [place] Office: [phone]  08/01/2020, 10:45 AM

## 2020-08-01 NOTE — Evaluation (Signed)
Clinical/Bedside Swallow Evaluation Patient Details  Name: Jonathan Miranda MRN: [id number] Date of Birth: 20-Mar-1951  Today's Date: 08/01/2020 Time: SLP Start Time (ACUTE ONLY): 0940 SLP Stop Time (ACUTE ONLY): 0950 SLP Time Calculation (min) (ACUTE ONLY): 10 min  Past Medical History:  Past Medical History:  Diagnosis Date  . Allergic rhinitis   . Aortic stenosis, mild 11/28/2015  . Back pain   . Cancer ([place])   . Hep A w/o coma   . Hyperlipidemia   . Hypertension   . Nephrolithiasis   . Personal history of colonic polyps   . Prostatitis   . Rash   . Tinnitus    Past Surgical History:  Past Surgical History:  Procedure Laterality Date  . correction deviated septum    . ORIF wrist- '05    . ureteroscopyp     HPI:  Jonathan Miranda is a 69 y.o. male with medical history significant of hypertension, hyperlipidemia, metastatic adenocarcinoma of the lung and bladder cancer seen in the emergency room for encephalopathy.  Code stroke called on patient arrival. HPi is limited due to pt being confused, he states he fell does not know when and how and when he woke up ems was there.Pt lives alone and per edmd note pt  was found on the floor and was last seen normal by friend who takes care of him.  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  With thin liquid there was immediate cough in 3 of 3 trials. Nectar thick liquid eliminated clinical s/s of aspiration.  Pt tolerated puree and thin liquid without overt s/s of aspiraiton.  With regular solid there was mild oral reside R>L.  Soft solid texture reduced residue and improved efficiency of oral phase.    Recommend mechanical soft diet with nectar thick liquids.  Pt would benefit from instrumental evaluation to further assess pharyngeal swallow function.  MBSS planned  for later this date.  SLP Visit Diagnosis: Dysphagia, unspecified (R13.10)    Aspiration Risk  Mild aspiration risk    Diet Recommendation Dysphagia 3 (Mech soft);Nectar-thick liquid   Liquid Administration via: Cup;Straw Medication Administration: Whole meds with liquid (or puree) Supervision: Staff to assist with self feeding Compensations: Slow rate;Small sips/bites Postural Changes: Seated upright at 90 degrees    Other  Recommendations Oral Care Recommendations: Oral care BID   Follow up Recommendations  (TBD)      Frequency and Duration  (TBD)          Prognosis Prognosis for Safe Diet Advancement:  (TBD)      Swallow Study   General HPI: Jonathan Miranda is a 69 y.o. male with medical history significant of hypertension, hyperlipidemia, metastatic adenocarcinoma of the lung and bladder cancer seen in the emergency room for encephalopathy.  Code stroke called on patient arrival. HPi is limited due to pt being confused, he states he fell does not know when and how and when he woke up ems was there.Pt lives alone and per edmd note pt  was found on the floor and was last seen normal by friend who takes care of him.  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."    Oral/Motor/Sensory Function Overall Oral Motor/Sensory Function: Mild impairment Facial ROM: Within Functional Limits Facial Symmetry: Abnormal symmetry left Lingual ROM:  (Could not test; reduced protrusion) Lingual Symmetry: Within  Functional Limits Lingual Strength: Reduced Velum: Within Functional Limits Mandible: Within Functional Limits   Ice Chips     Thin Liquid Thin Liquid: Impaired Pharyngeal  Phase Impairments: Cough - Immediate    Nectar Thick Nectar Thick Liquid: Within functional limits Presentation: Straw   Honey Thick Honey Thick Liquid: Not tested   Puree Puree: Within functional limits Presentation: Spoon   Solid     Solid:  Impaired Oral Phase Functional Implications: Oral residue      Jonathan Savage, MA, [place] Office: [phone]  08/01/2020,12:50 PM

## 2020-08-01 NOTE — Progress Notes (Signed)
Modified [place]ium Swallow Progress Note  Patient Details  Name: Jonathan Miranda MRN: [id number] Date of Birth: 1950-12-21  Today's Date: 08/01/2020  Modified Barium Swallow completed.  Full report located under Chart Review in the Imaging Section.  Brief recommendations include the following:  Clinical Impression  Pt presents with oropharyngeal dysphagia characterized by reduced bolus propulsion, reduced lingual retraction, a pharyngeal delay, reduced anterior laryngeal moevement, and reduce pharyngeal constriction. He demonstrated impaired A-P transport, vallecular residue, pyriform sinus residue, posterior pharyngeal wall residue, penetration (PAS 3) and aspiration (PAS 7,8) of thin liquids and with consecutive swallows of nectar thick liquids via straw. Delayed throat clearing was inconsistently noted following aspiration and this was ineffective in expelling the aspirant. Prompted coughing was effective in expelling some of the aspirant from the airway. A chin tuck posture sucessfully eliminated aspiration of thin liqudis via cup, but pt exhibited difficulty consistently demonstrating this strategy. Amount of pharyngeal residue increased with bolus size and with more advanced consistencies. Residue was improved with liquid washes and pt's independent use of secondary swallows. A dysphagia 3 diet with nectar thick liquids is recommended at this time with observance of swallowing precautions. SLP will follow for dysphagia treatment.    Swallow Evaluation Recommendations       SLP Diet Recommendations: Dysphagia 3 (Mech soft) solids;Nectar thick liquid   Liquid Administration via: Cup;Straw (individual sips only; avoid consecutive swallows)   Medication Administration: Crushed with puree   Supervision: Staff to assist with self feeding   Compensations: Slow rate;Small sips/bites;Follow solids with liquid   Postural Changes: Remain semi-upright after after feeds/meals (Comment)   Oral  Care Recommendations: Oral care BID       Dwanna Goshert I. Hardin Negus, [place], [place] Office number [phone] Pager [place] 08/01/2020,2:43 PM

## 2020-08-01 NOTE — Progress Notes (Signed)
Pt only AOx2 and not a good historian at this time, therefore admission documentation not complete.

## 2020-08-01 NOTE — Evaluation (Signed)
Physical Therapy Evaluation Patient Details Name: Jonathan Miranda MRN: [id number] DOB: 1951-03-13 Today's Date: 08/01/2020   History of Present Illness  69 y.o. male with medical history significant of hypertension, hyperlipidemia, metastatic adenocarcinoma of the lung and bladder cancer seen in the emergency room for encephalopathy. Pt reports he had a fall and woke p with EMS present. LKN was 10/26 when seen by a friend who takes care of him. Pt found to have multiple bilateral small infarcts on MRI.  Clinical Impression  Pt presents to PT with deficits in functional mobility, gait, balance, endurance, strength, power, cognition, awareness of deficits and safety. Pt with significant R weakness and impaired sitting balance resulting in physical assistance requirements for all functional mobility. Pt has significant impairments in cognition, and little awareness of his current deficits or their impact on his safety. Pt reports during session that the only place he will be going after leaving the hospital is to his home. PT recommends SNF placement at this time as the pt has little identified assistance and is at a high falls risk.    Follow Up Recommendations SNF;Supervision/Assistance - 24 hour    Equipment Recommendations  Wheelchair (measurements PT);Wheelchair cushion (measurements PT);Hospital bed (if home today)    Recommendations for Other Services       Precautions / Restrictions Precautions Precautions: Fall Restrictions Weight Bearing Restrictions: No      Mobility  Bed Mobility Overal bed mobility: Needs Assistance Bed Mobility: Supine to Sit;Sit to Supine     Supine to sit: Mod assist Sit to supine: Max assist        Transfers Overall transfer level: Needs assistance Equipment used: 1 person hand held assist Transfers: Sit to/from Stand Sit to Stand: Max assist         General transfer comment: maxA with L knee block  Ambulation/Gait                 Stairs            Wheelchair Mobility    Modified Rankin (Stroke Patients Only) Modified Rankin (Stroke Patients Only) Pre-Morbid Rankin Score: No symptoms Modified Rankin: Severe disability     Balance Overall balance assessment: Needs assistance Sitting-balance support: No upper extremity supported;Feet supported Sitting balance-Leahy Scale: Zero Sitting balance - Comments: maxA due to R lateral lean Postural control: Right lateral lean Standing balance support: Bilateral upper extremity supported Standing balance-Leahy Scale: Zero Standing balance comment: maxA with BUE support and knee block                             Pertinent Vitals/Pain Pain Assessment: No/denies pain    Home Living Family/patient expects to be discharged to:: Private residence Living Arrangements: Alone Available Help at Discharge: Friend(s);Available PRN/intermittently (limited PRN) Type of Home: House Home Access: Level entry     Home Layout: One level Home Equipment: Walker - 2 wheels;Bedside commode Additional Comments: need to confirm history with family or friend due to AMS    Prior Function Level of Independence: Independent         Comments: pt reports utilizing a RW for the past several weeks     Hand Dominance        Extremity/Trunk Assessment   Upper Extremity Assessment Upper Extremity Assessment: RUE deficits/detail RUE Deficits / Details: grip 3/5, otherwise 1/5 RUE strength, PROM WFL    Lower Extremity Assessment Lower Extremity Assessment: Difficult to assess due to impaired  cognition (pt reports his R leg is broken, difficulty following command)    Cervical / Trunk Assessment Cervical / Trunk Assessment: Kyphotic  Communication   Communication: No difficulties  Cognition Arousal/Alertness: Awake/alert Behavior During Therapy: WFL for tasks assessed/performed Overall Cognitive Status: Impaired/Different from baseline Area of  Impairment: Orientation;Attention;Memory;Following commands;Safety/judgement;Awareness;Problem solving                 Orientation Level: Disoriented to;Place;Time;Situation Current Attention Level: Sustained Memory: Decreased recall of precautions;Decreased short-term memory Following Commands: Follows one step commands with increased time Safety/Judgement: Decreased awareness of safety;Decreased awareness of deficits Awareness: Intellectual Problem Solving: Slow processing;Decreased initiation;Requires verbal cues;Requires tactile cues        General Comments General comments (skin integrity, edema, etc.): VSS on RA. Pt with impaired awareness of R side    Exercises     Assessment/Plan    PT Assessment Patient needs continued PT services  PT Problem List Decreased strength;Decreased activity tolerance;Decreased balance;Decreased mobility;Decreased cognition;Decreased knowledge of use of DME;Decreased safety awareness;Decreased knowledge of precautions       PT Treatment Interventions DME instruction;Gait training;Stair training;Functional mobility training;Therapeutic activities;Therapeutic exercise;Balance training;Neuromuscular re-education;Cognitive remediation;Patient/family education    PT Goals (Current goals can be found in the Care Plan section)  Acute Rehab PT Goals Patient Stated Goal: To go home to his dog PT Goal Formulation: With patient Time For Goal Achievement: 08/15/20 Potential to Achieve Goals: Good    Frequency Min 3X/week   Barriers to discharge        Co-evaluation               AM-PAC PT "6 Clicks" Mobility  Outcome Measure Help needed turning from your back to your side while in a flat bed without using bedrails?: A Lot Help needed moving from lying on your back to sitting on the side of a flat bed without using bedrails?: Total Help needed moving to and from a bed to a chair (including a wheelchair)?: Total Help needed standing up  from a chair using your arms (e.g., wheelchair or bedside chair)?: Total Help needed to walk in hospital room?: Total Help needed climbing 3-5 steps with a railing? : Total 6 Click Score: 7    End of Session   Activity Tolerance: Patient tolerated treatment well Patient left: in bed;with call bell/phone within reach;with bed alarm set Nurse Communication: Mobility status PT Visit Diagnosis: Other abnormalities of gait and mobility (R26.89);Muscle weakness (generalized) (M62.81);Hemiplegia and hemiparesis Hemiplegia - Right/Left: Right Hemiplegia - dominant/non-dominant: Dominant Hemiplegia - caused by: Cerebral infarction    Time: 0827-0850 PT Time Calculation (min) (ACUTE ONLY): 23 min   Charges:   PT Evaluation $PT Eval Moderate Complexity: 1 Mod          Zenaida Niece, PT, DPT Acute Rehabilitation Pager: [phone]   Zenaida Niece 08/01/2020, 9:04 AM

## 2020-08-01 NOTE — Progress Notes (Signed)
Neuro notified about patient not able to obtain head CT due to iodine allergy.  Patient will need Prep prior to going down for CT per CT staff.  No new orders.  Discussed with RN.

## 2020-08-01 NOTE — TOC Initial Note (Signed)
Transition of Care [facility]) - Initial/Assessment Note    Patient Details  Name: Jonathan Miranda MRN: [id number] Date of Birth: 1950/12/15  [facility] of [facility]) CM/SW Contact:    Pollie Friar, RN Phone Number: 08/01/2020, 3:15 PM  Clinical Narrative:                 CM met with the patient to discuss the recommendation for SNF rehab. Pt is refusing but noted that his orientation is documented as x1. CM also noted ST eval states some cognitive issues. CM reached out to pts sister that is his contact. She states he doesn't have any reliable supervision at home. A friend named Jacqlyn Larsen checks on him but she doesn't come daily.  Sister feels that SNF rehab is the best option at this time. CM has faxed the patient out in [facility] area per sister wishes.  TOC following.   Expected Discharge Plan: Skilled Nursing Facility Barriers to Discharge: Continued Medical Work up   Patient Goals and CMS Choice   CMS Medicare.gov Compare Post Acute Care list provided to:: Patient Represenative (must comment) Choice offered to / list presented to : Sibling  Expected Discharge Plan and Services Expected Discharge Plan: [place] In-house Referral: Clinical Social Work Discharge Planning Services: CM Consult Post Acute Care Choice: [place] arrangements for the past 2 months: [place]                                      Prior Living Arrangements/Services Living arrangements for the past 2 months: Single Family Home Lives with:: Self Patient language and need for interpreter reviewed:: Yes Do you feel safe going back to the place where you live?: Yes      Need for Family Participation in Patient Care: Yes (Comment) Care giver support system in place?: No (comment)   Criminal Activity/Legal Involvement Pertinent to Current Situation/Hospitalization: No - Comment as needed  Activities of Daily Living      Permission  Sought/Granted                  Emotional Assessment Appearance:: Appears stated age Attitude/Demeanor/Rapport: Engaged Affect (typically observed): Accepting Orientation: : Oriented to Self   Psych Involvement: No (comment)  Admission diagnosis:  Fall [W19.XXXA] Metastatic adenocarcinoma ([place]) [C79.9] Altered mental status, unspecified altered mental status type [R41.82] AMS (altered mental status) [R41.82] Acute CVA (cerebrovascular accident) [facility]) [I63.9] Patient Active Problem List   Diagnosis Date Noted  . Acute CVA (cerebrovascular accident) ([place]) 08/01/2020  . Metastatic lung cancer (metastasis from lung to other site) ([place]) 08/01/2020  . Cancer involving bladder by non-direct metastasis from prostate ([place]) 08/01/2020  . AMS (altered mental status) 07/31/2020  . Gross hematuria 05/04/2017  . Left ear hearing loss 11/04/2016  . Aortic stenosis, mild 11/28/2015  . Chronic prostatitis 11/01/2015  . Chronic hyponatremia 07/26/2014  . Diabetes ([place]) 07/19/2014  . Routine health maintenance 07/28/2011  . BACK PAIN 12/14/2008  . HYPERLIPIDEMIA, MIXED 05/06/2007  . TINNITUS NOS 05/06/2007  . Essential hypertension 05/06/2007  . ALLERGIC RHINITIS 05/06/2007  . COLONIC POLYPS, HX OF 05/06/2007  . NEPHROLITHIASIS, HX OF 05/06/2007   PCP:  Biagio Borg, MD Pharmacy:   [facility] [place], [address] [place] [address] Phone: [phone] Fax:  [phone]     Social Determinants of Health (SDOH) Interventions    Readmission Risk Interventions No flowsheet data found.

## 2020-08-01 NOTE — NC FL2 (Signed)
[place] LEVEL OF CARE SCREENING TOOL     IDENTIFICATION  Patient Name: Jonathan Miranda Birthdate: [birth year] Sex: male Admission Date (Current Location): 07/31/2020  [facility] and [place] Number:  Herbalist and Address:  [facility] [place], [place] [address], [place], [place] [address]      Provider Number: [id number]  Attending Physician Name and Address:  Damita Lack, MD  Relative Name and Phone Number:       Current Level of Care: Hospital Recommended Level of Care: [place] Prior Approval Number:    Date Approved/Denied:   PASRR Number: [id number]  Discharge Plan: SNF    Current Diagnoses: Patient Active Problem List   Diagnosis Date Noted  . Acute CVA (cerebrovascular accident) ([place]) 08/01/2020  . Metastatic lung cancer (metastasis from lung to other site) ([name]) 08/01/2020  . 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 AMS (altered mental status) 07/31/2020  . Gross hematuria 05/04/2017  . Left ear hearing loss 11/04/2016  . Aortic stenosis, mild 11/28/2015  . Chronic prostatitis 11/01/2015  . Chronic hyponatremia 07/26/2014  . Diabetes ([place]) 07/19/2014  . Routine health maintenance 07/28/2011  . BACK PAIN 12/14/2008  . HYPERLIPIDEMIA, MIXED 05/06/2007  . TINNITUS NOS 05/06/2007  . Essential hypertension 05/06/2007  . ALLERGIC RHINITIS 05/06/2007  . COLONIC POLYPS, HX OF 05/06/2007  . NEPHROLITHIASIS, HX OF 05/06/2007    Orientation RESPIRATION BLADDER Height & Weight     Self  Normal Incontinent Weight: 46.8 kg Height:  5\' 7"  (170.2 cm)  BEHAVIORAL SYMPTOMS/MOOD NEUROLOGICAL BOWEL NUTRITION STATUS      Continent Diet (dysphagia 3 with nectar thick liquids)  AMBULATORY STATUS COMMUNICATION OF NEEDS Skin   Extensive Assist Verbally Bruising, Skin abrasions (abrasion to buttocks/ bruising to wrist)                       Personal Care  Assistance Level of Assistance  Bathing, Feeding, Dressing Bathing Assistance: Maximum assistance Feeding assistance: Limited assistance Dressing Assistance: Maximum assistance     Functional Limitations Info  Sight, Hearing, Speech Sight Info: Impaired Hearing Info: Adequate Speech Info: Adequate    SPECIAL CARE FACTORS FREQUENCY  PT (By licensed PT), OT (By licensed OT), Speech therapy     PT Frequency: 5x/wk OT Frequency: 5x/wk     Speech Therapy Frequency: 5x/wk      Contractures Contractures Info: Not present    Additional Factors Info  Code Status, Allergies, Insulin Sliding Scale Code Status Info: Full Allergies Info: Zolpidem Statins Iodine Penicillins Shellfish Allergy   Insulin Sliding Scale Info: Novolog 0-9 units SQ every 4 hours       Current Medications (08/01/2020):  This is the current hospital active medication list Current Facility-Administered Medications  Medication Dose Route Frequency Provider Last Rate Last Admin  . 0.9 %  sodium chloride infusion   Intravenous Continuous Florina Ou V, MD      . 0.9 %  sodium chloride infusion   Intravenous Continuous Burnetta Sabin L, NP      . acetaminophen (TYLENOL) tablet 650 mg  650 mg Oral Q4H PRN Para Skeans, MD       Or  . acetaminophen (TYLENOL) 160 MG/5ML solution 650 mg  650 mg Per Tube Q4H PRN Para Skeans, MD       Or  . acetaminophen (TYLENOL) suppository 650 mg  650 mg Rectal Q4H PRN Para Skeans,  MD      . aspirin suppository 300 mg  300 mg Rectal Daily Florina Ou V, MD   300 mg at 07/31/20 2010   Or  . aspirin tablet 325 mg  325 mg Oral Daily Florina Ou V, MD      . ezetimibe (ZETIA) tablet 10 mg  10 mg Oral Daily Para Skeans, MD      . fluticasone (FLONASE) 50 MCG/ACT nasal spray 2 spray  2 spray Each Nare Daily Florina Ou V, MD      . heparin ADULT infusion 100 units/mL (25000 units/270mL sodium chloride 0.45%)  600 Units/hr Intravenous Continuous Skeet Simmer, [facility]      .  HYDROcodone-acetaminophen (NORCO/VICODIN) 5-325 MG per tablet 1 tablet  1 tablet Oral Q6H PRN Para Skeans, MD      . insulin aspart (novoLOG) injection 0-9 Units  0-9 Units Subcutaneous Q4H Chotiner, Yevonne Aline, MD   1 Units at 08/01/20 0414  . iohexol (OMNIPAQUE) 350 MG/ML injection 100 mL  100 mL Intravenous Once PRN Greta Doom, MD      . metoprolol tartrate (LOPRESSOR) tablet 12.5 mg  12.5 mg Oral BID Amin, Ankit Chirag, MD      . thiamine (B-1) injection 100 mg  100 mg Intravenous Daily Para Skeans, MD         Discharge Medications: Please see discharge summary for a list of discharge medications.  Relevant Imaging Results:  Relevant Lab Results:   Additional Information SS#: [id number]  Pollie Friar, RN

## 2020-08-01 NOTE — Progress Notes (Signed)
  Echocardiogram 2D Echocardiogram has been performed.  Jonathan Miranda 08/01/2020, 9:29 AM

## 2020-08-01 NOTE — Evaluation (Signed)
Occupational Therapy Evaluation Patient Details Name: Jonathan Miranda MRN: [id number] DOB: 11-02-1950 Today's Date: 08/01/2020    History of Present Illness 69 y.o. male with medical history significant of hypertension, hyperlipidemia, metastatic adenocarcinoma of the lung and bladder cancer seen in the emergency room for encephalopathy. Pt reports he had a fall and woke p with EMS present. LKN was 10/26 when seen by a friend who takes care of him. Pt found to have multiple bilateral small infarcts on MRI.   Clinical Impression   Pt admitted with the above diagnoses and presents with below problem list. Pt will benefit from continued acute OT to address the below listed deficits and maximize independence with basic ADLs prior to d/c to venue below. PTA pt was living alone per chart review. Pt unable to provide reliable home setup and PLOF data, chart review utilized. Pt currently max A with UB ADLs, max A +2 LB ADLs. Zero sitting balance this session: heavy right lateral lean and impaired functional use of RUE needing mod-max A for static sitting balance.      Follow Up Recommendations  SNF    Equipment Recommendations  Other (comment) (defer to next venue)    Recommendations for Other Services       Precautions / Restrictions Precautions Precautions: Fall Restrictions Weight Bearing Restrictions: No      Mobility Bed Mobility Overal bed mobility: Needs Assistance Bed Mobility: Supine to Sit;Sit to Supine     Supine to sit: Mod assist Sit to supine: Max assist   General bed mobility comments: mod A to power up trunk and pivot hips to full EOB position. Max A to control descent of trunk and advance BLE onto bed. Endorsed onset of dizziness returning to supine.     Transfers                 General transfer comment: mod-max A to static sit. Right lateral lean. Impaired functional use of RUE. Would need +2 assist to stand.    Balance Overall balance assessment:  Needs assistance Sitting-balance support: No upper extremity supported;Feet supported Sitting balance-Leahy Scale: Zero Sitting balance - Comments: maxA due to R lateral lean                                   ADL either performed or assessed with clinical judgement   ADL Overall ADL's : Needs assistance/impaired Eating/Feeding: NPO   Grooming: Maximal assistance;Sitting   Upper Body Bathing: Maximal assistance;Sitting   Lower Body Bathing: +2 for physical assistance;Maximal assistance;Sit to/from stand;Sitting/lateral leans   Upper Body Dressing : Maximal assistance;Sitting   Lower Body Dressing: Maximal assistance;+2 for physical assistance;Sitting/lateral leans;Sit to/from stand                 General ADL Comments: Pt completed bed mobility with mod A. Sat EOB about 3 minutes with mod to max A, right lateral lean. RUE impaired functional use.      Vision   Additional Comments: difficult to assess currently 2/2 impaired cognition     Perception     Praxis      Pertinent Vitals/Pain Pain Assessment: No/denies pain Faces Pain Scale: No hurt     Hand Dominance     Extremity/Trunk Assessment Upper Extremity Assessment Upper Extremity Assessment: Generalized weakness;RUE deficits/detail RUE Deficits / Details: grip 3/5, otherwise 1/5 RUE strength, PROM WFL. Unable to place ot maintain RUE in supportive position sitting  EOB. RUE Coordination: decreased fine motor;decreased gross motor   Lower Extremity Assessment Lower Extremity Assessment: Defer to PT evaluation   Cervical / Trunk Assessment Cervical / Trunk Assessment: Kyphotic   Communication Communication Communication: No difficulties   Cognition Arousal/Alertness: Awake/alert Behavior During Therapy: WFL for tasks assessed/performed (incongruent laughing at times) Overall Cognitive Status: Impaired/Different from baseline Area of Impairment: Orientation;Attention;Memory;Following  commands;Safety/judgement;Awareness;Problem solving                 Orientation Level: Disoriented to;Place;Time;Situation Current Attention Level: Sustained Memory: Decreased recall of precautions;Decreased short-term memory Following Commands: Follows one step commands with increased time Safety/Judgement: Decreased awareness of safety;Decreased awareness of deficits Awareness: Intellectual Problem Solving: Slow processing;Decreased initiation;Requires verbal cues;Requires tactile cues General Comments: incongruent laughing at times   General Comments  VSS on RA. Pt with impaired awareness of R side    Exercises     Shoulder Instructions      Home Living Family/patient expects to be discharged to:: Private residence Living Arrangements: Alone Available Help at Discharge: Friend(s);Available PRN/intermittently Type of Home: House Home Access: Level entry     Home Layout: One level     Bathroom Shower/Tub: Occupational psychologist: Standard     Home Equipment: Environmental consultant - 2 wheels;Bedside commode   Additional Comments: need to confirm history with family or friend due to AMS  Lives With: Alone    Prior Functioning/Environment Level of Independence: Independent;Independent with assistive device(s)        Comments: pt reports utilizing a RW for the past several weeks        OT Problem List: Decreased activity tolerance;Decreased strength;Decreased range of motion;Impaired balance (sitting and/or standing);Decreased coordination;Decreased cognition;Decreased safety awareness;Decreased knowledge of use of DME or AE;Decreased knowledge of precautions;Impaired tone;Impaired UE functional use;Pain      OT Treatment/Interventions: Self-care/ADL training;Therapeutic exercise;Neuromuscular education;DME and/or AE instruction;Energy conservation;Therapeutic activities;Cognitive remediation/compensation;Patient/family education;Balance training    OT  Goals(Current goals can be found in the care plan section) Acute Rehab OT Goals Patient Stated Goal: To go home to his dog OT Goal Formulation: With patient Time For Goal Achievement: 08/15/20 Potential to Achieve Goals: Fair ADL Goals Pt Will Perform Grooming: with supervision;bed level Pt Will Transfer to Toilet: with mod assist;with +2 assist;stand pivot transfer;bedside commode Pt Will Perform Toileting - Clothing Manipulation and hygiene: with mod assist;sitting/lateral leans Additional ADL Goal #1: Pt will complete bed mobility at min A level to prepare for EOB/OOB ADLs. Additional ADL Goal #2: Pt will sit EOB at min A level for 3 minutes to facilitate EOB ADLs.  OT Frequency: Min 2X/week   Barriers to D/C: Decreased caregiver support          Co-evaluation              AM-PAC OT "6 Clicks" Daily Activity     Outcome Measure Help from another person eating meals?: Total (NPO) Help from another person taking care of personal grooming?: A Lot Help from another person toileting, which includes using toliet, bedpan, or urinal?: Total Help from another person bathing (including washing, rinsing, drying)?: Total Help from another person to put on and taking off regular upper body clothing?: A Lot Help from another person to put on and taking off regular lower body clothing?: Total 6 Click Score: 8   End of Session Nurse Communication: Mobility status;Other (comment) (dizzy returning to supine)  Activity Tolerance: Patient limited by fatigue;Other (comment) (dizzy returning to supine) Patient left: in bed;with call bell/phone  within reach;with bed alarm set;Other (comment) (with transport services)  OT Visit Diagnosis: Unsteadiness on feet (R26.81);Muscle weakness (generalized) (M62.81);Other symptoms and signs involving cognitive function;Pain;Hemiplegia and hemiparesis Hemiplegia - Right/Left: Right                Time: 5093-2671 OT Time Calculation (min): 17  min Charges:  OT General Charges $OT Visit: 1 Visit OT Evaluation $OT Eval Moderate Complexity: [place], OT Acute Rehabilitation Services Pager: [phone] Office: [phone]   Hortencia Pilar 08/01/2020, 1:40 PM

## 2020-08-02 ENCOUNTER — Inpatient Hospital Stay (HOSPITAL_COMMUNITY): Payer: Medicare Other

## 2020-08-02 DIAGNOSIS — Z66 Do not resuscitate: Secondary | ICD-10-CM

## 2020-08-02 DIAGNOSIS — Z515 Encounter for palliative care: Secondary | ICD-10-CM

## 2020-08-02 DIAGNOSIS — Z7189 Other specified counseling: Secondary | ICD-10-CM

## 2020-08-02 LAB — CBC
HCT: 32.2 % — ABNORMAL LOW (ref 39.0–52.0)
Hemoglobin: 10.6 g/dL — ABNORMAL LOW (ref 13.0–17.0)
MCH: 28.2 pg (ref 26.0–34.0)
MCHC: 32.9 g/dL (ref 30.0–36.0)
MCV: 85.6 fL (ref 80.0–100.0)
Platelets: 228 10*3/uL (ref 150–400)
RBC: 3.76 MIL/uL — ABNORMAL LOW (ref 4.22–5.81)
RDW: 15.8 % — ABNORMAL HIGH (ref 11.5–15.5)
WBC: 18.6 10*3/uL — ABNORMAL HIGH (ref 4.0–10.5)
nRBC: 0 % (ref 0.0–0.2)

## 2020-08-02 LAB — GLUCOSE, CAPILLARY
Glucose-Capillary: 125 mg/dL — ABNORMAL HIGH (ref 70–99)
Glucose-Capillary: 161 mg/dL — ABNORMAL HIGH (ref 70–99)
Glucose-Capillary: 172 mg/dL — ABNORMAL HIGH (ref 70–99)
Glucose-Capillary: 229 mg/dL — ABNORMAL HIGH (ref 70–99)
Glucose-Capillary: 250 mg/dL — ABNORMAL HIGH (ref 70–99)
Glucose-Capillary: 257 mg/dL — ABNORMAL HIGH (ref 70–99)
Glucose-Capillary: 381 mg/dL — ABNORMAL HIGH (ref 70–99)

## 2020-08-02 LAB — HEPARIN LEVEL (UNFRACTIONATED)
Heparin Unfractionated: 0.25 IU/mL — ABNORMAL LOW (ref 0.30–0.70)
Heparin Unfractionated: 0.35 IU/mL (ref 0.30–0.70)

## 2020-08-02 LAB — APTT
aPTT: 43 seconds — ABNORMAL HIGH (ref 24–36)
aPTT: 47 seconds — ABNORMAL HIGH (ref 24–36)

## 2020-08-02 MED ORDER — HEPARIN (PORCINE) 25000 UT/250ML-% IV SOLN
850.0000 [IU]/h | INTRAVENOUS | Status: AC
  Administered 2020-08-03: 800 [IU]/h via INTRAVENOUS
  Filled 2020-08-02: qty 250

## 2020-08-02 MED ORDER — IOHEXOL 300 MG/ML  SOLN
75.0000 mL | Freq: Once | INTRAMUSCULAR | Status: AC | PRN
  Administered 2020-08-02: 75 mL via INTRAVENOUS

## 2020-08-02 NOTE — Progress Notes (Signed)
ANTICOAGULATION CONSULT NOTE - Initial Consult  Pharmacy Consult for Heparin Indication: pulmonary embolus   Allergies  Allergen Reactions  . Zolpidem Anaphylaxis    Mother and sister had hallucinations to Ambien. Patient will decline taking this due family history.   . Statins Hives  . Iodine Hives  . Penicillins Hives  . Shellfish Allergy Hives    Patient Measurements: Height: 5\' 7"  (170.2 cm) Weight: 46.8 kg (103 lb 2.8 oz) IBW/kg (Calculated) : 66.1 Heparin Dosing Weight: 46.8 kg  Vital Signs: Temp: 98.2 F (36.8 C) (10/29 1258) Temp Source: Oral (10/29 1258) BP: 139/78 (10/29 1258) Pulse Rate: 88 (10/29 1258)  Labs: Recent Labs    07/31/20 1323 07/31/20 1323 07/31/20 1327 08/02/20 0244 08/02/20 1143  HGB 11.3*   < > 11.6* 10.6*  --   HCT 34.3*  --  34.0* 32.2*  --   PLT 294  --   --  228  --   APTT 27  --   --  43* 47*  LABPROT 17.2*  --   --   --   --   INR 1.5*  --   --   --   --   HEPARINUNFRC  --   --   --  0.25* 0.35  CREATININE 1.38*  --  1.20  --   --    < > = values in this interval not displayed.    Estimated Creatinine Clearance: 37.4 mL/min (by C-G formula based on SCr of 1.2 mg/dL).   Medical History: Past Medical History:  Diagnosis Date  . Allergic rhinitis   . Aortic stenosis, mild 11/28/2015  . Back pain   . Cancer ([place])   . Hep A w/o coma   . Hyperlipidemia   . Hypertension   . Nephrolithiasis   . Personal history of colonic polyps   . Prostatitis   . Rash   . Tinnitus    Assessment:  69 yr old male with hx PE 07/19/20 to begin IV heparin.  New CVA on admit 10/26.  Outpatient notes indicate that he was given Xarelto starter pack on 07/19/20.  Upon prompting, patient reports that he forgot to mention that medication on admit, but has been taking it.  Last dose reported 07/30/20, unknown time.  Plan no heparin boluses with new CVA, and low therapeutic goals.  Since recent Xarelto may falsely elevate heparin levels, will check  aPTTs and heparin levels until they correlate.  08/02/20 1143 APTT subtherapeutic at 47 with heparin infusing at 700 units/hr APTT and HL not yet correlating  Goal of Therapy:  Heparin level 0.3-0.5 units/ml aPTT 66-85 seconds Monitor platelets by anticoagulation protocol: Yes   Plan:   Increase heparin drip to 800 units/hr (17 units/kg/hour)  Heparin level and aPTT ~8 hrs after rate change  Daily heparin level and aPTT until correlating; daily CBC.  Xarelto on hold.  Erricka Falkner P. Legrand Como, PharmD, [place] Please utilize Amion for appropriate phone number to reach the unit pharmacist ([place]) 08/02/2020 1:26 PM

## 2020-08-02 NOTE — TOC CAGE-AID Note (Signed)
Transition of Care [facility]) - CAGE-AID Screening   Patient Details  Name: Jonathan Miranda MRN: [id number] Date of Birth: 1951/01/05  [facility]) CM/SW Contact:    Emeterio Reeve, [place] Phone Number: 08/02/2020, 4:26 PM   Clinical Narrative:  Pt was not able to participate in assessment. Pt is only oriented to self.   CAGE-AID Screening: Substance Abuse Screening unable to be completed due to: : Patient unable to participate            [place] Clinical Social Worker [phone]

## 2020-08-02 NOTE — Consult Note (Signed)
Palliative Medicine Inpatient Consult Note  Reason for consult:  Goals of Care "[place].  Multiple advanced co-morbidities"  HPI:  Per intake H&P --> Jonathan Miranda is a 69 y.o. male with medical history significant of hypertension, hyperlipidemia, metastatic adenocarcinoma of the lung and bladder Miranda seen in the emergency room for encephalopathy.  Code stroke called on patient arrival.No family at bedside. HPi is limited due to pt being confused, he states he fell does not know when and how and when he woke up ems was there.Pt lives alone and per edmd note pt  was found on the floor and was last seen normal by friend who takes care of him yesterday.  Palliative care was asked to get involved to aid in goals of care conversations in the setting of metastatic adenocarcinoma of the lung and bladder Miranda among other comorbidities.   Clinical Assessment/Goals of Care: I have reviewed medical records including EPIC notes, labs and imaging, received report from bedside RN, assessed the patient.    I met with Jonathan Miranda and called his sister, Jonathan Miranda  to further discuss diagnosis prognosis, GOC, EOL wishes, disposition and options.   I introduced Palliative Medicine as specialized medical care for people living with serious illness. It focuses on providing relief from the symptoms and stress of a serious illness. The goal is to improve quality of life for both the patient and the family.  Jonathan Miranda is from [place], [place]. He has never been married and does not have any children. He has a dog, Jonathan Miranda who he expresses his love for. He has two sisters, Jonathan Miranda and Jonathan Miranda. 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  Prior to hospitalization Jonathan Miranda was living independently - he was able to complete all bADL's for himself.   A detailed discussion was had today regarding advanced  directives - patient has never completed these though would be interested at this point in time in doing so. Patient states that he would want his very good friend Jonathan Miranda to be his decision maker in the event that he is unable to make decisions for himself.  Concepts specific to code status, artifical feeding and hydration, continued IV antibiotics and rehospitalization was had. I completed a MOST form today. The patient and family outlined their wishes for the following treatment decisions:  Cardiopulmonary Resuscitation: Do Not Attempt Resuscitation (DNR/No CPR)  Medical Interventions: Limited Additional Interventions: Use medical treatment, IV fluids and cardiac monitoring as indicated, DO NOT USE intubation or mechanical ventilation. May consider use of less invasive airway support such as BiPAP or CPAP. Also provide comfort measures. Transfer to the hospital if indicated. Avoid intensive care.   Antibiotics: Determine use of limitation of antibiotics when infection occurs  IV Fluids: IV fluids for a defined trial period  Feeding Tube: No feeding tube   The difference between a aggressive medical intervention path  and a palliative comfort care path for this patient at this time was had. Patient states that being in his home and with his dog is what he would like. He also expresses the hope for improvement in strength and getting back to a level of independence. We discussed his poor overall health state and his metastatic adenocarcinoma of the lung and bladder Miranda.  We also discussed the patient's multiple infarctions upon this hospitalization and how this will likely change his future possibly leading to further disability.  We talked about his poor recovery likelihood - I described hospice as a service for patients for have a life expectancy of < 6 months. It preserves dignity and quality at the end phases of life. The focus changes from curative to symptom relief. He and his sister  are amenable to getting more information and speaking with the outpatient Palliative care team.  I was able to bring up with his Sister Jonathan Miranda separately that I am wary Jonathan Miranda is not comprehending the severity of his disease process.  I shared that it is important that she and his friend Jonathan Miranda talk about the future in terms of his finances and what perhaps he could afford for long-term care.  Discussed the importance of continued conversation with family and their  medical providers regarding overall plan of care and treatment options, ensuring decisions are within the context of the patients values and GOCs.  Decision Maker: Jonathan Miranda (Friend) [phone] Jonathan Miranda (sister) - [phone]  SUMMARY OF RECOMMENDATIONS   DNAR/DNI  MOST Completed, paper copy placed onto the chart electric copy can be found in [facility]  DNR Form Completed, paper copy placed onto the chart electric copy can be found in [facility]  Treat what is treatable  TOC - OP Palliative and information on hospice services  Chaplain-Advanced directives  Code Status/Advance Care Planning: DNAR/DNI  Palliative Prophylaxis:   Oral Care, Mobility  Additional Recommendations (Limitations, Scope, Preferences):  Continue current scope of care   Psycho-social/Spiritual:   Desire for further Chaplaincy support: Yes - [employer]  Additional Recommendations: Education on hospice   Prognosis: Poor - months  Discharge Planning: Discharge to skilled nursing facility  Oral Intake %: Poor 0%  Bowel Movements: Yes - smooth Mobility:  Needs full assistance  Vitals:   08/01/20 1634 08/01/20 2114  BP: 138/60 (!) 141/71  Pulse: 87 99  Resp: 18 18  Temp: 98.5 F (36.9 C) 98.4 F (36.9 C)    Intake/Output Summary (Last 24 hours) at 08/02/2020 5993 Last data filed at 08/01/2020 1745 Gross per 24 hour  Intake 191.98 ml  Output --  Net 191.98 ml   Last Weight  Most recent update: 07/31/2020  8:59 PM    Weight  46.8 kg (103 lb 2.8 oz)           Gen:  NAD HEENT: moist mucous membranes CV: Regular rate and rhythm, no murmurs rubs or gallops PULM: clear to auscultation bilaterally. No wheezes/rales/rhonchi ABD: soft/nontender/nondistended/normal bowel sounds EXT: No edema Neuro: Alert and oriented x3  PPS: 30%   This conversation/these recommendations were discussed with patient primary care team, Dr. Reesa Chew  Time In: 0645 Time Out: 0800 Total Time: 75 Greater than 50%  of this time was spent counseling and coordinating care related to the above assessment and plan.  [place] Team Team Cell Phone: [phone] Please utilize secure chat with additional questions, if there is no response within 30 minutes please call the above phone number  Palliative Medicine Team providers are available by phone from 7am to 7pm daily and can be reached through the team cell phone.  Should this patient require assistance outside of these hours, please call the patient's attending physician.

## 2020-08-02 NOTE — Progress Notes (Signed)
This chaplain responded to PMT-MYF consult to notarize Pt. HCPOA.  The Pt. declined notarizing HCPOA today stating, "I have some important conversations to complete, before making a decision." The chaplain respected the Pt. choice and planned a F/U for Monday.

## 2020-08-02 NOTE — TOC Progression Note (Addendum)
Transition of Care [facility]) - Progression Note    Patient Details  Name: [name] MRN: [id number] Date of Birth: [birth year]  [facility]) CM/SW Contact  Pollie Friar, RN Phone Number: 08/02/2020, 11:27 AM  Clinical Narrative:    SNF choices provided to his sister [name]. TOC following.  1200 pm: pt has visitors in the room. One is a good friend of his that the patient states can have information. Friend states that the patient plans on naming another friend of theirs his POA on Monday. CM informed him that the patient would have to be A&O to complete the paperwork and currently he is not. Friend states understanding and is in agreement with sister making decisions for the current time. Friend also confirms that pt is NOT safe to return home.  TOC following. Pt has received all 3 Pfizer vaccines.  Expected Discharge Plan: [place] Barriers to Discharge: Continued Medical Work up  Expected Discharge Plan and Services Expected Discharge Plan: [place] In-house Referral: Clinical Social Work Discharge Planning Services: CM Consult Post Acute Care Choice: [place] arrangements for the past 2 months: Single Family Home                                       Social Determinants of Health (SDOH) Interventions    Readmission Risk Interventions No flowsheet data found.

## 2020-08-02 NOTE — Progress Notes (Signed)
STROKE TEAM PROGRESS NOTE   INTERVAL HISTORY He has a good friend at the bedside. He wants to go home but therapists recommend rehab. RUE remains quite plegic though right leg is improving. Vital signs are stable.He prefers rehab in [facility]   F/U CT head with contrast is pending Vitals:   08/01/20 1300 08/01/20 1634 08/01/20 2114 08/02/20 0747  BP: 127/66 138/60 (!) 141/71 (!) 139/58  Pulse: (!) 106 87 99 88  Resp:  18 18 18   Temp:  98.5 F (36.9 C) 98.4 F (36.9 C) 97.9 F (36.6 C)  TempSrc:  Oral  Oral  SpO2:  100% 100% 100%  Weight:      Height: 5\' 7"  (1.702 m)      CBC:  Recent Labs  Lab 07/31/20 1323 07/31/20 1323 07/31/20 1327 08/02/20 0244  WBC 23.6*  --   --  18.6*  NEUTROABS 18.5*  --   --   --   HGB 11.3*   < > 11.6* 10.6*  HCT 34.3*   < > 34.0* 32.2*  MCV 87.3  --   --  85.6  PLT 294  --   --  228   < > = values in this interval not displayed.   Basic Metabolic Panel:  Recent Labs  Lab 07/31/20 1323 07/31/20 1327  NA 137 136  K 4.4 4.6  CL 102 103  CO2 19*  --   GLUCOSE 235* 235*  BUN 31* 39*  CREATININE 1.38* 1.20  CALCIUM 9.5  --    Lipid Panel:  Recent Labs  Lab 08/01/20 0428  CHOL 235*  TRIG 241*  HDL 36*  CHOLHDL 6.5  VLDL 48*  LDLCALC 151*   HgbA1c:  Recent Labs  Lab 07/31/20 1755  HGBA1C 8.1*   Urine Drug Screen:  Recent Labs  Lab 07/31/20 1459  LABOPIA NONE DETECTED  COCAINSCRNUR NONE DETECTED  LABBENZ NONE DETECTED  AMPHETMU NONE DETECTED  THCU POSITIVE*  LABBARB NONE DETECTED    Alcohol Level  Recent Labs  Lab 07/31/20 1323  ETH <10    IMAGING past 24 hours DG Swallowing Func-Speech Pathology  Result Date: 08/01/2020 Objective Swallowing Evaluation: Type of Study: MBS-Modified Barium Swallow Study  Patient Details Name: Jonathan Miranda MRN: [id number] Date of Birth: 1951-06-10 Today's Date: 08/01/2020 Time: SLP Start Time (ACUTE ONLY): 1332 -SLP Stop Time (ACUTE ONLY): 1349 SLP Time Calculation (min)  (ACUTE ONLY): 17 min Past Medical History: Past Medical History: Diagnosis Date . Allergic rhinitis  . Aortic stenosis, mild 11/28/2015 . Back pain  . Cancer ([place])  . Hep A w/o coma  . Hyperlipidemia  . Hypertension  . Nephrolithiasis  . Personal history of colonic polyps  . Prostatitis  . Rash  . Tinnitus  Past Surgical History: Past Surgical History: Procedure Laterality Date . correction deviated septum   . ORIF wrist- '05   . ureteroscopyp   HPI: Jonathan Miranda is a 69 y.o. male with medical history significant of hypertension, hyperlipidemia, metastatic adenocarcinoma of the lung and bladder cancer seen in the emergency room for encephalopathy.  Code stroke called on patient arrival. HPi is limited due to pt being confused, he states he fell does not know when and how and when he woke up ems was there.Pt lives alone and per edmd note pt  was found on the floor and was last seen normal by friend who takes care of him.  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."  Subjective: Pt awake, alert, pleasant, participative Assessment / Plan / Recommendation CHL IP CLINICAL IMPRESSIONS 08/01/2020 Clinical Impression Pt presents with oropharyngeal dysphagia characterized by reduced bolus propulsion, reduced lingual retraction, a pharyngeal delay, reduced anterior laryngeal moevement, and reduce pharyngeal constriction. He demonstrated impaired A-P transport, vallecular residue, pyriform sinus residue, posterior pharyngeal wall residue, penetration (PAS 3) and aspiration (PAS 7,8) of thin liquids and with consecutive swallows of nectar thick liquids via straw. Delayed throat clearing was inconsistently noted following aspiration and this was ineffective in expelling the aspirant. Prompted coughing was effective in expelling some of the aspirant from the airway. A chin tuck posture sucessfully eliminated aspiration of thin liqudis via cup, but pt exhibited difficulty  consistently demonstrating this strategy. Amount of pharyngeal residue increased with bolus size and with more advanced consistencies. Residue was improved with liquid washes and pt's independent use of secondary swallows. A dysphagia 3 diet with nectar thick liquids is recommended at this time with observance of swallowing precautions. SLP will follow for dysphagia treatment.  SLP Visit Diagnosis Dysphagia, oropharyngeal phase (R13.12) Attention and concentration deficit following -- Frontal lobe and executive function deficit following -- Impact on safety and function Mild aspiration risk   CHL IP TREATMENT RECOMMENDATION 08/01/2020 Treatment Recommendations Defer until completion of intrumental exam   Prognosis 08/01/2020 Prognosis for Safe Diet Advancement Good Barriers to Reach Goals Cognitive deficits Barriers/Prognosis Comment -- CHL IP DIET RECOMMENDATION 08/01/2020 SLP Diet Recommendations Dysphagia 3 (Mech soft) solids;Nectar thick liquid Liquid Administration via Cup;Straw Medication Administration Crushed with puree Compensations Slow rate;Small sips/bites;Follow solids with liquid Postural Changes Remain semi-upright after after feeds/meals (Comment)   CHL IP OTHER RECOMMENDATIONS 08/01/2020 Recommended Consults -- Oral Care Recommendations Oral care BID Other Recommendations --   CHL IP FOLLOW UP RECOMMENDATIONS 08/01/2020 Follow up Recommendations Skilled Nursing facility   [facility] IP FREQUENCY AND DURATION 08/01/2020 Speech Therapy Frequency (ACUTE ONLY) min 2x/week Treatment Duration 2 weeks      CHL IP ORAL PHASE 08/01/2020 Oral Phase Impaired Oral - Pudding Teaspoon -- Oral - Pudding Cup -- Oral - Honey Teaspoon -- Oral - Honey Cup -- Oral - Nectar Teaspoon -- Oral - Nectar Cup -- Oral - Nectar Straw -- Oral - Thin Teaspoon -- Oral - Thin Cup Reduced posterior propulsion Oral - Thin Straw -- Oral - Puree -- Oral - Mech Soft -- Oral - Regular -- Oral - Multi-Consistency -- Oral - Pill -- Oral Phase -  Comment --  CHL IP PHARYNGEAL PHASE 08/01/2020 Pharyngeal Phase Impaired Pharyngeal- Pudding Teaspoon -- Pharyngeal -- Pharyngeal- Pudding Cup -- Pharyngeal -- Pharyngeal- Honey Teaspoon -- Pharyngeal -- Pharyngeal- Honey Cup -- Pharyngeal -- Pharyngeal- Nectar Teaspoon -- Pharyngeal -- Pharyngeal- Nectar Cup Reduced tongue base retraction;Delayed swallow initiation-vallecula;Pharyngeal residue - pyriform;Pharyngeal residue - valleculae;Pharyngeal residue - posterior pharnyx;Reduced pharyngeal peristalsis Pharyngeal -- Pharyngeal- Nectar Straw Reduced tongue base retraction;Delayed swallow initiation-vallecula;Pharyngeal residue - pyriform;Pharyngeal residue - valleculae;Pharyngeal residue - posterior pharnyx;Reduced pharyngeal peristalsis;Penetration/Aspiration during swallow Pharyngeal Material enters airway, passes BELOW cords without attempt by patient to eject out (silent aspiration) Pharyngeal- Thin Teaspoon -- Pharyngeal -- Pharyngeal- Thin Cup Reduced tongue base retraction;Delayed swallow initiation-vallecula;Pharyngeal residue - pyriform;Pharyngeal residue - valleculae;Pharyngeal residue - posterior pharnyx;Reduced pharyngeal peristalsis Pharyngeal -- Pharyngeal- Thin Straw Reduced tongue base retraction;Delayed swallow initiation-vallecula;Pharyngeal residue - pyriform;Pharyngeal residue - valleculae;Pharyngeal residue - posterior pharnyx;Reduced pharyngeal peristalsis Pharyngeal -- Pharyngeal- Puree Reduced tongue base retraction;Pharyngeal residue - pyriform;Pharyngeal residue - valleculae;Pharyngeal residue - posterior pharnyx;Reduced pharyngeal peristalsis Pharyngeal -- Pharyngeal- Mechanical Soft Reduced  tongue base retraction;Pharyngeal residue - pyriform;Pharyngeal residue - valleculae;Pharyngeal residue - posterior pharnyx;Reduced pharyngeal peristalsis Pharyngeal -- Pharyngeal- Regular Reduced tongue base retraction;Pharyngeal residue - pyriform;Pharyngeal residue - valleculae;Pharyngeal residue  - posterior pharnyx;Reduced pharyngeal peristalsis Pharyngeal -- Pharyngeal- Multi-consistency -- Pharyngeal -- Pharyngeal- Pill Reduced tongue base retraction;Delayed swallow initiation-vallecula;Pharyngeal residue - pyriform;Pharyngeal residue - valleculae;Pharyngeal residue - posterior pharnyx;Reduced pharyngeal peristalsis Pharyngeal -- Pharyngeal Comment --  CHL IP CERVICAL ESOPHAGEAL PHASE 08/01/2020 Cervical Esophageal Phase WFL Pudding Teaspoon -- Pudding Cup -- Honey Teaspoon -- Honey Cup -- Nectar Teaspoon -- Nectar Cup -- Nectar Straw -- Thin Teaspoon -- Thin Cup -- Thin Straw -- Puree -- Mechanical Soft -- Regular -- Multi-consistency -- Pill -- Cervical Esophageal Comment -- Shanika I. Hardin Negus, [place], [place] Office number [phone] Pager [phone] Horton [name] 08/01/2020, 2:49 PM               PHYSICAL EXAM   Frail cachectic malnourished looking elderly Caucasian male not in distress. . Afebrile. Head is nontraumatic. Neck is supple without bruit.    Cardiac exam no murmur or gallop. Lungs are clear to auscultation. Distal pulses are well felt. Neurological Exam : He is awake alert oriented to time place and person.  Speech and language appear normal.  Right lower facial weakness.  Tongue midline.  Motor system exam shows right upper extremity plegia with barely grade 1/5 strength with hypotonia.  Right lower extremity strength is 4/5.  Normal strength on the left.  Sensation is diminished on the right hemibody.  Normal on the left.  Gait not tested.     ASSESSMENT/PLAN Jonathan Miranda is a 69 y.o. male with history of metastatic adenocarcinoma of the lung as well as highly invasive bladder cancer presenting following a fall with R sided weakness.   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 B  frontal lesions w/ volume loss. ASPECTS 9.    CTA head & neck no LVO or significant stenosis. No enhancement.  CT perfusion no core or penumbra  MRI  Multiple small B infarcts  Repeat CT head w/ contrast to look for mets. 13h prep ordered given hx shellfish allergy.  pending     2D Echo EF 65-70%. No source of embolus. Moderate pericardial effusion. Moderate AV stenosis   LDL 151  HgbA1c 8.1   VTE prophylaxis - Heparin 5000 units sq tid   aspirin 81 mg daily prior to admission, now on heparin IV will transition to NOAC at discharge.    Therapy recommendations:  SNF - pt does not want to do rehab at [facility] - he is interested in [facility].    Disposition:  pending   PE  On IV heparin  Hypertension  Stable . Permissive hypertension (OK if < 220/120) but gradually normalize in 5-7 days . Long-term BP goal normotensive  Hyperlipidemia  Home meds:  zetia, resumed in hospital  Intolerant to statins  LDL 151, goal < 70  Diabetes, type II  HgbA1c 8.1, goal < 7.0  Hyperglycemia, ? R/t steroids  Other Stroke Risk Factors  Advanced age  ETOH use, alcohol level <10, advised to drink no more than 2 drink(s) a day  Substance abuse - UDS:  THC POSITIVE. Patient advised to stop using due to stroke risk.  Mild aortic stenosis   Other Active Problems  metastatic adenocarcinoma of  the lung   highly invasive bladder cancer   Cachexia, Body mass index is 16.16 kg/m.   NS added at [facility] day # 1  Check f/u CT head w/wo and change iv heparin to NOAC at discharge. Long d/w patient and friend at bedside and answered questions. Antony Contras, MD To contact Stroke Continuity provider, please refer to [email or web address] After hours, contact General Neurology

## 2020-08-02 NOTE — Progress Notes (Signed)
Physical Therapy Treatment Patient Details Name: LAVEL RIEMAN MRN: [id number] DOB: 03/01/1951 Today's Date: 08/02/2020    History of Present Illness 69 y.o. male with medical history significant of hypertension, hyperlipidemia, metastatic adenocarcinoma of the lung and bladder cancer seen in the emergency room for encephalopathy. Pt reports he had a fall and woke p with EMS present. LKN was 10/26 when seen by a friend who takes care of him. Pt found to have multiple bilateral small infarcts on MRI.    PT Comments    Pt tolerates treatment well with improved transfer quality this session and RUE strength. Pt continues to demonstrate impaired awareness of deficits and significant balance and mobility deficits resulting in a need of physical assistance for all mobility to reduce falls risk. Pt will continue to benefit from PT POC to reduce falls risk and caregiver burden. PT continues to recommend SNF placement at this time.  Follow Up Recommendations  SNF;Supervision/Assistance - 24 hour     Equipment Recommendations  Wheelchair (measurements PT);Wheelchair cushion (measurements PT);Hospital bed    Recommendations for Other Services       Precautions / Restrictions Precautions Precautions: Fall Restrictions Weight Bearing Restrictions: No    Mobility  Bed Mobility Overal bed mobility: Needs Assistance Bed Mobility: Rolling;Sidelying to Sit Rolling: Min guard Sidelying to sit: Mod assist       General bed mobility comments: pt with impaired RUE strength to push into sitting after rolling onto R side  Transfers Overall transfer level: Needs assistance Equipment used: 1 person hand held assist;Rolling walker (2 wheeled) Transfers: Sit to/from [facility] Sit to Stand: Min assist Stand pivot transfers: Mod assist       General transfer comment: cues for hand placement, cues for step sequencing. Cues to correct R lateral lean  Ambulation/Gait                  Stairs             Wheelchair Mobility    Modified Rankin (Stroke Patients Only) Modified Rankin (Stroke Patients Only) Pre-Morbid Rankin Score: No symptoms Modified Rankin: Moderately severe disability     Balance Overall balance assessment: Needs assistance Sitting-balance support: Single extremity supported;Feet supported Sitting balance-Leahy Scale: Poor Sitting balance - Comments: modA due to R lateral lean Postural control: Right lateral lean Standing balance support: Single extremity supported Standing balance-Leahy Scale: Poor Standing balance comment: min-modA                            Cognition Arousal/Alertness: Awake/alert Behavior During Therapy: WFL for tasks assessed/performed Overall Cognitive Status: Impaired/Different from baseline Area of Impairment: Attention;Memory;Following commands;Safety/judgement;Awareness;Problem solving                   Current Attention Level: Sustained Memory: Decreased recall of precautions;Decreased short-term memory Following Commands: Follows one step commands with increased time Safety/Judgement: Decreased awareness of safety;Decreased awareness of deficits Awareness: Intellectual Problem Solving: Slow processing;Difficulty sequencing;Requires verbal cues;Requires tactile cues        Exercises General Exercises - Lower Extremity Ankle Circles/Pumps: AROM;Both;5 reps Gluteal Sets: AROM;Both;5 reps Straight Leg Raises: AROM;Right;5 reps    General Comments General comments (skin integrity, edema, etc.): VSS on RA      Pertinent Vitals/Pain Pain Assessment: No/denies pain    Home Living                      Prior Function  PT Goals (current goals can now be found in the care plan section) Acute Rehab PT Goals Patient Stated Goal: To go home to his dog Progress towards PT goals: Progressing toward goals    Frequency    Min 3X/week      PT  Plan Current plan remains appropriate    Co-evaluation              AM-PAC PT "6 Clicks" Mobility   Outcome Measure  Help needed turning from your back to your side while in a flat bed without using bedrails?: A Little Help needed moving from lying on your back to sitting on the side of a flat bed without using bedrails?: A Lot Help needed moving to and from a bed to a chair (including a wheelchair)?: A Lot Help needed standing up from a chair using your arms (e.g., wheelchair or bedside chair)?: A Little Help needed to walk in hospital room?: Total Help needed climbing 3-5 steps with a railing? : Total 6 Click Score: 12    End of Session   Activity Tolerance: Patient tolerated treatment well Patient left: in chair;with call bell/phone within reach;with chair alarm set Nurse Communication: Mobility status PT Visit Diagnosis: Other abnormalities of gait and mobility (R26.89);Muscle weakness (generalized) (M62.81);Hemiplegia and hemiparesis Hemiplegia - Right/Left: Right Hemiplegia - dominant/non-dominant: Dominant Hemiplegia - caused by: Cerebral infarction     Time: 9476-5465 PT Time Calculation (min) (ACUTE ONLY): 24 min  Charges:  $Therapeutic Activity: 23-37 mins                     Zenaida Niece, PT, DPT Acute Rehabilitation Pager: [phone]    Zenaida Niece 08/02/2020, 8:12 AM

## 2020-08-02 NOTE — Progress Notes (Signed)
PROGRESS NOTE    Jonathan Miranda  VPX:[id number] DOB: 02-Mar-1951 DOA: 07/31/2020 PCP: Biagio Borg, MD   Brief Narrative:  69 y.o. male with medical history significant of hypertension, hyperlipidemia, metastatic adenocarcinoma of the lung and bladder cancer seen in the emergency room for encephalopathy.  The symptoms were very nonspecific.  EEG was unremarkable, CT head was negative but MRI showed multifocal CVA.  Neurology team was consulted.  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 fib or hypercoagulable state from underlying malignancy. -CT head negative but MRI confirms CVA -CT head with contrast ordered to evaluate for metastatic disease.  Hives to contrast therefore prep ordered. -Echocardiogram -EF 65 to 70%, grade 1 DD, moderate pericardial effusion -LDL-151, A1c-8.1 -Neurology consulted.  Permissive hypertension -Antiplatelet vs Anticoagulation.   Nonobstructive bilateral pulmonary embolism, diagnosed 07/19/2020 Mediastinal lymphadenopathy -Recent diagnosis, on outpatient Eliquis?  On heparin drip for now  Moderate pericardial effusion -No tamponade physiology but I suspect this is malignant in nature  Sinus tachycardia, improved -EKG-negative for A. fib which showed sinus tachycardia -Continue metoprolol 25 mg twice daily  Essential hypertension -Permissive hypertension  Leukocytosis, chronic -Secondary to underlying malignancy and prednisone use.  Diabetes mellitus type 2 -Likely induced from steroids.  Insulin  sliding/Accu-Chek.  Hyperlipidemia -On Zetia  High-grade invasive bladder cancer Metastatic adenocarcinoma of the lung -Follows at [facility].  Given acute CVA, pulmonary embolism and advanced malignancy-palliative care team to see the patient today.  DVT prophylaxis:  Heparin drip   Code Status: Full code Family Communication: Spoke with his sister  Dispo: The patient is from: Home              Anticipated d/c is to: SNF              Anticipated d/c date is: 1-2 days              Patient currently is not medically stable to d/c.  Ongoing work-up for acute CVA.       Body mass index is 16.16 kg/m.      Subjective: Patient sitting in the recliner alert to his name only.  Continues to tell me he wants to go home today but I explained to him that he still undergoing neurology work-up and we need to establish some goals of care.  Otherwise denies any complaints.  Patient's RN at bedside during my visit  Review of Systems Otherwise negative except as per HPI, including: General: Denies fever, chills, night sweats or unintended weight loss. Resp: Denies cough, wheezing, shortness of breath. Cardiac: Denies chest pain, palpitations, orthopnea, paroxysmal nocturnal dyspnea. GI: Denies abdominal pain, nausea, vomiting, diarrhea or constipation GU: Denies dysuria, frequency, hesitancy or incontinence MS: Denies muscle aches, joint pain or swelling Neuro: Denies headache, neurologic deficits (focal weakness, numbness, tingling), abnormal gait Psych: Denies anxiety, depression, SI/HI/AVH Skin: Denies new rashes or lesions ID: Denies sick contacts, exotic exposures, travel  Examination: Constitutional: Not in acute distress Respiratory: Clear to auscultation bilaterally Cardiovascular: Normal sinus rhythm, no rubs Abdomen: Nontender nondistended good bowel sounds Musculoskeletal: No edema noted Skin:  No rashes seen Neurologic: Difficult to assess but mostly  nonfocal Psychiatric: Poor judgment and insight Objective: Vitals:   08/01/20 1300 08/01/20 1634 08/01/20 2114 08/02/20 0747  BP: 127/66 138/60 (!) 141/71 (!) 139/58  Pulse: (!) 106 87 99 88  Resp:  18 18 18   Temp:  98.5 F (36.9 C) 98.4 F (36.9 C) 97.9 F (36.6 C)  TempSrc:  Oral  Oral  SpO2:  100% 100% 100%  Weight:      Height: 5\' 7"  (1.702 m)       Intake/Output Summary (Last 24 hours) at 08/02/2020 0818 Last data filed at 08/01/2020 1745 Gross per 24 hour  Intake 191.98 ml  Output --  Net 191.98 ml   Filed Weights   07/31/20 1355 07/31/20 2059  Weight: 50.3 kg 46.8 kg     Data Reviewed:   CBC: Recent Labs  Lab 07/31/20 1323 07/31/20 1327 08/02/20 0244  WBC 23.6*  --  18.6*  NEUTROABS 18.5*  --   --   HGB 11.3* 11.6* 10.6*  HCT 34.3* 34.0* 32.2*  MCV 87.3  --  85.6  PLT 294  --  481   Basic Metabolic Panel: Recent Labs  Lab 07/31/20 1323 07/31/20 1327  NA 137 136  K 4.4 4.6  CL 102 103  CO2 19*  --   GLUCOSE 235* 235*  BUN 31* 39*  CREATININE 1.38* 1.20  CALCIUM 9.5  --    GFR: Estimated Creatinine Clearance: 37.4 mL/min (by C-G formula based on SCr of 1.2 mg/dL). Liver Function Tests: Recent Labs  Lab 07/31/20 1323  AST 36  ALT 20  ALKPHOS 176*  BILITOT 1.5*  PROT 6.0*  ALBUMIN 3.2*   No results for input(s): LIPASE, AMYLASE in the last 168 hours. No results for input(s): AMMONIA in the last 168 hours. Coagulation Profile: Recent Labs  Lab 07/31/20 1323  INR 1.5*   Cardiac Enzymes: No results for input(s): CKTOTAL, CKMB, CKMBINDEX, TROPONINI in the last 168 hours. BNP (last 3 results) No results for input(s): PROBNP in the last 8760 hours. HbA1C: Recent Labs    07/31/20 1755  HGBA1C 8.1*   CBG: Recent Labs  Lab 08/01/20 1625 08/01/20 2020 08/02/20 0024 08/02/20 0416 08/02/20 0750  GLUCAP 246* 211* 125* 161* 172*   Lipid Profile: Recent Labs    08/01/20 0428  CHOL 235*  HDL 36*  LDLCALC 151*  TRIG 241*   CHOLHDL 6.5   Thyroid Function Tests: No results for input(s): TSH, T4TOTAL, FREET4, T3FREE, THYROIDAB in the last 72 hours. Anemia Panel: No results for input(s): VITAMINB12, FOLATE, FERRITIN, TIBC, IRON, RETICCTPCT in the last 72 hours. Sepsis Labs: No results for input(s): PROCALCITON, LATICACIDVEN in the last 168 hours.  Recent Results (from the past 240 hour(s))  Respiratory Panel by RT PCR (Flu A&B, Covid) - Nasopharyngeal Swab     Status: None   Collection Time: 07/31/20  4:39 PM   Specimen: Nasopharyngeal Swab  Result Value Ref Range Status   SARS Coronavirus 2 by RT PCR NEGATIVE NEGATIVE Final    Comment: (NOTE) SARS-CoV-2 target nucleic acids are NOT DETECTED.  The SARS-CoV-2 RNA is generally detectable in upper respiratoy specimens during the acute phase of infection. The lowest concentration of SARS-CoV-2 viral copies this assay can detect is 131 copies/mL. A negative result does not preclude SARS-Cov-2 infection and should not be used as the sole basis for treatment or other patient management decisions. A negative result may occur with  improper specimen collection/handling,  submission of specimen other than nasopharyngeal swab, presence of viral mutation(s) within the areas targeted by this assay, and inadequate number of viral copies (<131 copies/mL). A negative result must be combined with clinical observations, patient history, and epidemiological information. The expected result is Negative.  Fact Sheet for Patients:  PinkCheek.be  Fact Sheet for Healthcare Providers:  GravelBags.it  This test is no t yet approved or cleared by the [place] FDA and  has been authorized for detection and/or diagnosis of SARS-CoV-2 by FDA under an Emergency Use Authorization (EUA). This EUA will remain  in effect (meaning this test can be used) for the duration of the COVID-19 declaration under Section 564(b)(1)  of the Act, 21 U.S.C. section 360bbb-3(b)(1), unless the authorization is terminated or revoked sooner.     Influenza A by PCR NEGATIVE NEGATIVE Final   Influenza B by PCR NEGATIVE NEGATIVE Final    Comment: (NOTE) The Xpert Xpress SARS-CoV-2/FLU/RSV assay is intended as an aid in  the diagnosis of influenza from Nasopharyngeal swab specimens and  should not be used as a sole basis for treatment. Nasal washings and  aspirates are unacceptable for Xpert Xpress SARS-CoV-2/FLU/RSV  testing.  Fact Sheet for Patients: PinkCheek.be  Fact Sheet for Healthcare Providers: GravelBags.it  This test is not yet approved or cleared by the [place] FDA and  has been authorized for detection and/or diagnosis of SARS-CoV-2 by  FDA under an Emergency Use Authorization (EUA). This EUA will remain  in effect (meaning this test can be used) for the duration of the  Covid-19 declaration under Section 564(b)(1) of the Act, 21  U.S.C. section 360bbb-3(b)(1), unless the authorization is  terminated or revoked. Performed at [facility], [place] [address], [place], [place] [address]          Radiology Studies: CT Code Stroke CTA Head W/WO contrast  Result Date: 07/31/2020 CLINICAL DATA:  Right-sided weakness EXAM: CT ANGIOGRAPHY HEAD AND NECK CT PERFUSION BRAIN TECHNIQUE: Multidetector CT imaging of the head and neck was performed using the standard protocol during bolus administration of intravenous contrast. Multiplanar CT image reconstructions and MIPs were obtained to evaluate the vascular anatomy. Carotid stenosis measurements (when applicable) are obtained utilizing NASCET criteria, using the distal internal carotid diameter as the denominator. Multiphase CT imaging of the brain was performed following IV bolus contrast injection. Subsequent parametric perfusion maps were calculated using RAPID software. CONTRAST:  167mL OMNIPAQUE  IOHEXOL 350 MG/ML SOLN COMPARISON:  None. FINDINGS: CTA NECK Aortic arch: Mild plaque along the aortic arch and great vessel origins, which are patent. Right carotid system: Patent. Primarily calcified plaque at the ICA origin causing less than 50% stenosis. Left carotid system: Patent. Primarily calcified plaque at the ICA origin causing less than 50% stenosis. Vertebral arteries: Patent.  Left vertebral artery is dominant. Skeleton: Advanced multilevel degenerative changes of the cervical spine. Degenerative changes of the temporomandibular joints. Other neck: No neck mass. Right supraclavicular adenopathy identified on prior chest CT appears to be present but poorly evaluated due to artifact. Upper chest: Included upper lungs are clear. Review of the MIP images confirms the above findings CTA HEAD Anterior circulation: Intracranial internal carotid arteries patent with mild calcified plaque. Anterior and middle cerebral arteries are patent. Posterior circulation: Intracranial vertebral arteries are patent. Minimal calcified plaque on the left. Basilar artery is patent. Posterior cerebral arteries are patent. Venous sinuses: Patent as allowed by contrast bolus timing. No apparent enhancement in the region of bilateral frontal hypodense  lesions. Review of the MIP images confirms the above findings CT Brain Perfusion Findings: CBF (<30%) Volume: 85mL Perfusion (Tmax>6.0s) volume: 54mL Mismatch Volume: 33mL Infarction Location: None IMPRESSION: No large vessel occlusion or hemodynamically significant stenosis. Perfusion imaging demonstrates no evidence of core infarction or penumbra. No apparent enhancement or definite hyperperfusion in the region of low-density lesions on head CT. Electronically Signed   By: Macy Mis M.D.   On: 07/31/2020 14:11   CT Code Stroke CTA Neck W/WO contrast  Result Date: 07/31/2020 CLINICAL DATA:  Right-sided weakness EXAM: CT ANGIOGRAPHY HEAD AND NECK CT PERFUSION BRAIN TECHNIQUE:  Multidetector CT imaging of the head and neck was performed using the standard protocol during bolus administration of intravenous contrast. Multiplanar CT image reconstructions and MIPs were obtained to evaluate the vascular anatomy. Carotid stenosis measurements (when applicable) are obtained utilizing NASCET criteria, using the distal internal carotid diameter as the denominator. Multiphase CT imaging of the brain was performed following IV bolus contrast injection. Subsequent parametric perfusion maps were calculated using RAPID software. CONTRAST:  166mL OMNIPAQUE IOHEXOL 350 MG/ML SOLN COMPARISON:  None. FINDINGS: CTA NECK Aortic arch: Mild plaque along the aortic arch and great vessel origins, which are patent. Right carotid system: Patent. Primarily calcified plaque at the ICA origin causing less than 50% stenosis. Left carotid system: Patent. Primarily calcified plaque at the ICA origin causing less than 50% stenosis. Vertebral arteries: Patent.  Left vertebral artery is dominant. Skeleton: Advanced multilevel degenerative changes of the cervical spine. Degenerative changes of the temporomandibular joints. Other neck: No neck mass. Right supraclavicular adenopathy identified on prior chest CT appears to be present but poorly evaluated due to artifact. Upper chest: Included upper lungs are clear. Review of the MIP images confirms the above findings CTA HEAD Anterior circulation: Intracranial internal carotid arteries patent with mild calcified plaque. Anterior and middle cerebral arteries are patent. Posterior circulation: Intracranial vertebral arteries are patent. Minimal calcified plaque on the left. Basilar artery is patent. Posterior cerebral arteries are patent. Venous sinuses: Patent as allowed by contrast bolus timing. No apparent enhancement in the region of bilateral frontal hypodense lesions. Review of the MIP images confirms the above findings CT Brain Perfusion Findings: CBF (<30%) Volume: 4mL  Perfusion (Tmax>6.0s) volume: 68mL Mismatch Volume: 64mL Infarction Location: None IMPRESSION: No large vessel occlusion or hemodynamically significant stenosis. Perfusion imaging demonstrates no evidence of core infarction or penumbra. No apparent enhancement or definite hyperperfusion in the region of low-density lesions on head CT. Electronically Signed   By: Macy Mis M.D.   On: 07/31/2020 14:11   MR BRAIN WO CONTRAST  Result Date: 07/31/2020 CLINICAL DATA:  Code stroke follow-up EXAM: MRI HEAD WITHOUT CONTRAST TECHNIQUE: Multiplanar, multiecho pulse sequences of the brain and surrounding structures were obtained without intravenous contrast. COMPARISON:  None. FINDINGS: Diffusion-weighted imaging was obtained. Patient could not tolerate remainder of study. Significant motion artifact is present. There are numerous small foci of reduced diffusion involving bilateral frontoparietal and occipital lobes as well as the cerebellum. IMPRESSION: Single sequence study with significant motion degradation. Multiple small acute infarcts involving bilateral vascular territories. Electronically Signed   By: Macy Mis M.D.   On: 07/31/2020 19:13   CT C-SPINE NO CHARGE  Result Date: 07/31/2020 CLINICAL DATA:  69 year old male with fall and neck trauma. EXAM: CT CERVICAL SPINE WITHOUT CONTRAST TECHNIQUE: Multidetector CT imaging of the cervical spine was performed without intravenous contrast. Multiplanar CT image reconstructions were also generated. COMPARISON:  CT angiography dated 07/31/2020. FINDINGS:  Alignment: Acute subluxation. Grade 1 C4-C5 anterolisthesis. Skull base and vertebrae: No acute fracture. Soft tissues and spinal canal: No prevertebral fluid or swelling. No visible canal hematoma. Disc levels: Multilevel degenerative changes with endplate irregularity and disc space narrowing and spurring. Multilevel facet arthropathy. Upper chest: Negative. Other: A subcentimeter right thyroid hypodense  nodule. Not clinically significant; no follow-up imaging recommended (ref: J Am Coll Radiol. 2015 Feb;12(2): 143-50). Bilateral carotid bulb calcified plaques with IMPRESSION: 1. No acute/traumatic cervical spine pathology. 2. Multilevel degenerative changes. Electronically Signed   By: Anner Crete M.D.   On: 07/31/2020 16:13   CT Code Stroke Cerebral Perfusion with contrast  Result Date: 07/31/2020 CLINICAL DATA:  Right-sided weakness EXAM: CT ANGIOGRAPHY HEAD AND NECK CT PERFUSION BRAIN TECHNIQUE: Multidetector CT imaging of the head and neck was performed using the standard protocol during bolus administration of intravenous contrast. Multiplanar CT image reconstructions and MIPs were obtained to evaluate the vascular anatomy. Carotid stenosis measurements (when applicable) are obtained utilizing NASCET criteria, using the distal internal carotid diameter as the denominator. Multiphase CT imaging of the brain was performed following IV bolus contrast injection. Subsequent parametric perfusion maps were calculated using RAPID software. CONTRAST:  112mL OMNIPAQUE IOHEXOL 350 MG/ML SOLN COMPARISON:  None. FINDINGS: CTA NECK Aortic arch: Mild plaque along the aortic arch and great vessel origins, which are patent. Right carotid system: Patent. Primarily calcified plaque at the ICA origin causing less than 50% stenosis. Left carotid system: Patent. Primarily calcified plaque at the ICA origin causing less than 50% stenosis. Vertebral arteries: Patent.  Left vertebral artery is dominant. Skeleton: Advanced multilevel degenerative changes of the cervical spine. Degenerative changes of the temporomandibular joints. Other neck: No neck mass. Right supraclavicular adenopathy identified on prior chest CT appears to be present but poorly evaluated due to artifact. Upper chest: Included upper lungs are clear. Review of the MIP images confirms the above findings CTA HEAD Anterior circulation: Intracranial internal  carotid arteries patent with mild calcified plaque. Anterior and middle cerebral arteries are patent. Posterior circulation: Intracranial vertebral arteries are patent. Minimal calcified plaque on the left. Basilar artery is patent. Posterior cerebral arteries are patent. Venous sinuses: Patent as allowed by contrast bolus timing. No apparent enhancement in the region of bilateral frontal hypodense lesions. Review of the MIP images confirms the above findings CT Brain Perfusion Findings: CBF (<30%) Volume: 26mL Perfusion (Tmax>6.0s) volume: 11mL Mismatch Volume: 49mL Infarction Location: None IMPRESSION: No large vessel occlusion or hemodynamically significant stenosis. Perfusion imaging demonstrates no evidence of core infarction or penumbra. No apparent enhancement or definite hyperperfusion in the region of low-density lesions on head CT. Electronically Signed   By: Macy Mis M.D.   On: 07/31/2020 14:11   DG Swallowing Func-Speech Pathology  Result Date: 08/01/2020 Objective Swallowing Evaluation: Type of Study: MBS-Modified Barium Swallow Study  Patient Details Name: Jonathan Miranda MRN: [id number] Date of Birth: 1951-05-18 Today's Date: 08/01/2020 Time: SLP Start Time (ACUTE ONLY): 1332 -SLP Stop Time (ACUTE ONLY): 1027 SLP Time Calculation (min) (ACUTE ONLY): 17 min Past Medical History: Past Medical History: Diagnosis Date . Allergic rhinitis  . Aortic stenosis, mild 11/28/2015 . Back pain  . Cancer ([place])  . Hep A w/o coma  . Hyperlipidemia  . Hypertension  . Nephrolithiasis  . Personal history of colonic polyps  . Prostatitis  . Rash  . Tinnitus  Past Surgical History: Past Surgical History: Procedure Laterality Date . correction deviated septum   . ORIF wrist- '05   .  ureteroscopyp   HPI: ARMEL RABBANI is a 69 y.o. male with medical history significant of hypertension, hyperlipidemia, metastatic adenocarcinoma of the lung and bladder cancer seen in the emergency room for encephalopathy.  Code  stroke called on patient arrival. HPi is limited due to pt being confused, he states he fell does not know when and how and when he woke up ems was there.Pt lives alone and per edmd note pt  was found on the floor and was last seen normal by friend who takes care of him.  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."  Subjective: Pt awake, alert, pleasant, participative Assessment / Plan / Recommendation CHL IP CLINICAL IMPRESSIONS 08/01/2020 Clinical Impression Pt presents with oropharyngeal dysphagia characterized by reduced bolus propulsion, reduced lingual retraction, a pharyngeal delay, reduced anterior laryngeal moevement, and reduce pharyngeal constriction. He demonstrated impaired A-P transport, vallecular residue, pyriform sinus residue, posterior pharyngeal wall residue, penetration (PAS 3) and aspiration (PAS 7,8) of thin liquids and with consecutive swallows of nectar thick liquids via straw. Delayed throat clearing was inconsistently noted following aspiration and this was ineffective in expelling the aspirant. Prompted coughing was effective in expelling some of the aspirant from the airway. A chin tuck posture sucessfully eliminated aspiration of thin liqudis via cup, but pt exhibited difficulty consistently demonstrating this strategy. Amount of pharyngeal residue increased with bolus size and with more advanced consistencies. Residue was improved with liquid washes and pt's independent use of secondary swallows. A dysphagia 3 diet with nectar thick liquids is recommended at this time with observance of swallowing precautions. SLP will follow for dysphagia treatment.  SLP Visit Diagnosis Dysphagia, oropharyngeal phase (R13.12) Attention and concentration deficit following -- Frontal lobe and executive function deficit following -- Impact on safety and function Mild aspiration risk   CHL IP TREATMENT RECOMMENDATION 08/01/2020 Treatment Recommendations  Defer until completion of intrumental exam   Prognosis 08/01/2020 Prognosis for Safe Diet Advancement Good Barriers to Reach Goals Cognitive deficits Barriers/Prognosis Comment -- CHL IP DIET RECOMMENDATION 08/01/2020 SLP Diet Recommendations Dysphagia 3 (Mech soft) solids;Nectar thick liquid Liquid Administration via Cup;Straw Medication Administration Crushed with puree Compensations Slow rate;Small sips/bites;Follow solids with liquid Postural Changes Remain semi-upright after after feeds/meals (Comment)   CHL IP OTHER RECOMMENDATIONS 08/01/2020 Recommended Consults -- Oral Care Recommendations Oral care BID Other Recommendations --   CHL IP FOLLOW UP RECOMMENDATIONS 08/01/2020 Follow up Recommendations Skilled Nursing facility   [facility] IP FREQUENCY AND DURATION 08/01/2020 Speech Therapy Frequency (ACUTE ONLY) min 2x/week Treatment Duration 2 weeks      CHL IP ORAL PHASE 08/01/2020 Oral Phase Impaired Oral - Pudding Teaspoon -- Oral - Pudding Cup -- Oral - Honey Teaspoon -- Oral - Honey Cup -- Oral - Nectar Teaspoon -- Oral - Nectar Cup -- Oral - Nectar Straw -- Oral - Thin Teaspoon -- Oral - Thin Cup Reduced posterior propulsion Oral - Thin Straw -- Oral - Puree -- Oral - Mech Soft -- Oral - Regular -- Oral - Multi-Consistency -- Oral - Pill -- Oral Phase - Comment --  CHL IP PHARYNGEAL PHASE 08/01/2020 Pharyngeal Phase Impaired Pharyngeal- Pudding Teaspoon -- Pharyngeal -- Pharyngeal- Pudding Cup -- Pharyngeal -- Pharyngeal- Honey Teaspoon -- Pharyngeal -- Pharyngeal- Honey Cup -- Pharyngeal -- Pharyngeal- Nectar Teaspoon -- Pharyngeal -- Pharyngeal- Nectar Cup Reduced tongue base retraction;Delayed swallow initiation-vallecula;Pharyngeal residue - pyriform;Pharyngeal residue - valleculae;Pharyngeal residue - posterior pharnyx;Reduced pharyngeal peristalsis Pharyngeal -- Pharyngeal- Nectar Straw Reduced tongue base retraction;Delayed swallow initiation-vallecula;Pharyngeal residue -  pyriform;Pharyngeal residue -  valleculae;Pharyngeal residue - posterior pharnyx;Reduced pharyngeal peristalsis;Penetration/Aspiration during swallow Pharyngeal Material enters airway, passes BELOW cords without attempt by patient to eject out (silent aspiration) Pharyngeal- Thin Teaspoon -- Pharyngeal -- Pharyngeal- Thin Cup Reduced tongue base retraction;Delayed swallow initiation-vallecula;Pharyngeal residue - pyriform;Pharyngeal residue - valleculae;Pharyngeal residue - posterior pharnyx;Reduced pharyngeal peristalsis Pharyngeal -- Pharyngeal- Thin Straw Reduced tongue base retraction;Delayed swallow initiation-vallecula;Pharyngeal residue - pyriform;Pharyngeal residue - valleculae;Pharyngeal residue - posterior pharnyx;Reduced pharyngeal peristalsis Pharyngeal -- Pharyngeal- Puree Reduced tongue base retraction;Pharyngeal residue - pyriform;Pharyngeal residue - valleculae;Pharyngeal residue - posterior pharnyx;Reduced pharyngeal peristalsis Pharyngeal -- Pharyngeal- Mechanical Soft Reduced tongue base retraction;Pharyngeal residue - pyriform;Pharyngeal residue - valleculae;Pharyngeal residue - posterior pharnyx;Reduced pharyngeal peristalsis Pharyngeal -- Pharyngeal- Regular Reduced tongue base retraction;Pharyngeal residue - pyriform;Pharyngeal residue - valleculae;Pharyngeal residue - posterior pharnyx;Reduced pharyngeal peristalsis Pharyngeal -- Pharyngeal- Multi-consistency -- Pharyngeal -- Pharyngeal- Pill Reduced tongue base retraction;Delayed swallow initiation-vallecula;Pharyngeal residue - pyriform;Pharyngeal residue - valleculae;Pharyngeal residue - posterior pharnyx;Reduced pharyngeal peristalsis Pharyngeal -- Pharyngeal Comment --  CHL IP CERVICAL ESOPHAGEAL PHASE 08/01/2020 Cervical Esophageal Phase WFL Pudding Teaspoon -- Pudding Cup -- Honey Teaspoon -- Honey Cup -- Nectar Teaspoon -- Nectar Cup -- Nectar Straw -- Thin Teaspoon -- Thin Cup -- Thin Straw -- Puree -- Mechanical Soft -- Regular -- Multi-consistency -- Pill --  Cervical Esophageal Comment -- Shanika I. Hardin Negus, [place], [place] Office number [phone] Pager [place] 08/01/2020, 2:49 PM              EEG adult  Result Date: 07/31/2020 Lora Havens, MD     07/31/2020  3:29 PM Patient Name: ANTHONNY SCHILLER MRN: [id number] Epilepsy Attending: Lora Havens Referring Physician/Provider: Dr. Kathrynn Speed Date: 07/31/2020 Duration: 23.56 mins Patient history: 69 year old male presented with sudden onset of right-sided weakness.  EEG done for seizures. Level of alertness: Awake, asleep AEDs during EEG study: None Technical aspects: This EEG study was done with scalp electrodes positioned according to the 10-20 International system of electrode placement. Electrical activity was acquired at a sampling rate of 500Hz  and reviewed with a high frequency filter of 70Hz  and a low frequency filter of 1Hz . EEG data were recorded continuously and digitally stored. Description: During awake state, no clear posterior dominant rhythm was seen.  Sleep was characterized by vertex waves, sleep spindles (12 to 14 Hz), maximal frontocentral region.  EEG showed continuous generalized at times rhythmic 3 to 6 Hz theta-delta slowing.  Hyperventilation and photic stimulation were not performed.   ABNORMALITY -Continuous slow, generalized IMPRESSION: This study is suggestive of mild to moderate diffuse encephalopathy, nonspecific etiology.  No seizures or epileptiform discharges were seen throughout the recording. Lora Havens   ECHOCARDIOGRAM COMPLETE  Result Date: 08/01/2020    ECHOCARDIOGRAM REPORT   Patient Name:   DEAVON PODGORSKI Date of Exam: 08/01/2020 Medical Rec #:  [id number]           Height:       67.0 in Accession #:    [id number]          Weight:       103.2 lb Date of Birth:  12/05/1950           BSA:          1.526 m Patient Age:    23 years            BP:           110/78 mmHg Patient Gender: M  HR:           112 bpm. Exam Location:  Inpatient Procedure: 2D Echo Indications:    stroke 434.91  History:        Patient has prior history of Echocardiogram examinations, most                 recent 11/27/2015. Stroke; Risk Factors:Diabetes, Dyslipidemia                 and Hypertension.  Sonographer:    Johny Chess Referring Phys: [id number] EKTA V PATEL  Sonographer Comments: Image acquisition challenging due to uncooperative patient and Image acquisition challenging due to respiratory motion. IMPRESSIONS  1. Left ventricular ejection fraction, by estimation, is 65 to 70%. The left ventricle has hyperdynamic function. The left ventricle has no regional wall motion abnormalities. Left ventricular diastolic parameters are consistent with Grade I diastolic dysfunction (impaired relaxation).  2. Right ventricular systolic function is normal. The right ventricular size is normal. Tricuspid regurgitation signal is inadequate for assessing PA pressure.  3. Moderate pericardial effusion, primarily along the RV free wall. < 25% respirophasic variation in the mitral valve E inflow doppler velocity, small IVC collapses normally, no RV diastolic collapse. No evidence for tamponade.  4. The mitral valve is normal in structure. No evidence of mitral valve regurgitation. No evidence of mitral stenosis. Moderate mitral annular calcification.  5. The aortic valve is tricuspid. Aortic valve regurgitation is mild. Moderate aortic valve stenosis. Aortic valve area, by VTI measures 1.15 cm. Aortic valve mean gradient measures 22.0 mmHg.  6. The inferior vena cava is normal in size with greater than 50% respiratory variability, suggesting right atrial pressure of 3 mmHg. FINDINGS  Left Ventricle: Left ventricular ejection fraction, by estimation, is 65 to 70%. The left ventricle has hyperdynamic function. The left ventricle has no regional wall motion abnormalities. The left ventricular internal cavity size was normal in  size. There is no left ventricular hypertrophy. Left ventricular diastolic parameters are consistent with Grade I diastolic dysfunction (impaired relaxation). Right Ventricle: The right ventricular size is normal. No increase in right ventricular wall thickness. Right ventricular systolic function is normal. Tricuspid regurgitation signal is inadequate for assessing PA pressure. Left Atrium: Left atrial size was normal in size. Right Atrium: Right atrial size was normal in size. Pericardium: A moderately sized pericardial effusion is present. Mitral Valve: The mitral valve is normal in structure. There is mild calcification of the mitral valve leaflet(s). Moderate mitral annular calcification. No evidence of mitral valve regurgitation. No evidence of mitral valve stenosis. Tricuspid Valve: The tricuspid valve is normal in structure. Tricuspid valve regurgitation is not demonstrated. Aortic Valve: The aortic valve is tricuspid. Aortic valve regurgitation is mild. Aortic regurgitation PHT measures 224 msec. Moderate aortic stenosis is present. Aortic valve mean gradient measures 22.0 mmHg. Aortic valve peak gradient measures 35.9 mmHg. Aortic valve area, by VTI measures 1.15 cm. Pulmonic Valve: The pulmonic valve was normal in structure. Pulmonic valve regurgitation is not visualized. Aorta: The aortic root is normal in size and structure. Venous: The inferior vena cava is normal in size with greater than 50% respiratory variability, suggesting right atrial pressure of 3 mmHg. IAS/Shunts: No atrial level shunt detected by color flow Doppler.  LEFT VENTRICLE PLAX 2D LVIDd:         4.20 cm  Diastology LVIDs:         3.00 cm  LV e' medial:  6.09 cm/s LV PW:  0.80 cm  LV e' lateral: 7.51 cm/s LV IVS:        0.80 cm LVOT diam:     1.80 cm LV SV:         50 LV SV Index:   33 LVOT Area:     2.54 cm  RIGHT VENTRICLE             IVC RV S prime:     16.20 cm/s  IVC diam: 1.10 cm TAPSE (M-mode): 2.3 cm LEFT ATRIUM              Index       RIGHT ATRIUM          Index LA diam:        2.90 cm 1.90 cm/m  RA Area:     8.20 cm LA Vol (A2C):   26.4 ml 17.30 ml/m RA Volume:   15.60 ml 10.22 ml/m LA Vol (A4C):   30.6 ml 20.05 ml/m LA Biplane Vol: 29.7 ml 19.46 ml/m  AORTIC VALVE AV Area (Vmax):    0.99 cm AV Area (Vmean):   1.06 cm AV Area (VTI):     1.15 cm AV Vmax:           299.50 cm/s AV Vmean:          203.500 cm/s AV VTI:            0.435 m AV Peak Grad:      35.9 mmHg AV Mean Grad:      22.0 mmHg LVOT Vmax:         116.50 cm/s LVOT Vmean:        84.600 cm/s LVOT VTI:          0.197 m LVOT/AV VTI ratio: 0.45 AI PHT:            224 msec  AORTA Ao Root diam: 2.80 cm  SHUNTS Systemic VTI:  0.20 m Systemic Diam: 1.80 cm Loralie Champagne MD Electronically signed by Loralie Champagne MD Signature Date/Time: 08/01/2020/3:14:23 PM    Final    CT HEAD CODE STROKE WO CONTRAST  Addendum Date: 07/31/2020   ADDENDUM REPORT: 07/31/2020 19:46 ADDENDUM: Dictation error in second point of impression. Possible acute infarct at junction of left superior and precentral gyri is consistent with right-sided symptoms. Electronically Signed   By: Macy Mis M.D.   On: 07/31/2020 19:46   Result Date: 07/31/2020 CLINICAL DATA:  Code stroke.  Right-sided weakness EXAM: CT HEAD WITHOUT CONTRAST TECHNIQUE: Contiguous axial images were obtained from the base of the skull through the vertex without intravenous contrast. COMPARISON:  None. FINDINGS: Significant streak artifact through the skull base and inferior posterior fossa due to dental amalgam. Brain: There is no acute intracranial hemorrhage. Low-density lesion of the left middle frontal gyrus measures up to 1.5 cm coronally. Additional right middle frontal gyrus low-density lesion measures about 9 mm. Question of small area of loss of gray-white differentiation at the junction of superior and precentral gyri (series 3, image 29). Prominence of the ventricles and sulci reflects generalized  parenchymal volume loss. No extra-axial collection or hydrocephalus. Vascular: No hyperdense vessel. There is mild intracranial atherosclerotic calcification at the skull base. Skull: Unremarkable Sinuses/Orbits: Evidence of prior sinonasal surgery. Small right maxillary sinus air-fluid level. No acute abnormality of the orbits. Other: Mastoid air cells are clear. ASPECTS [facility] Stroke Program Early CT Score) - Ganglionic level infarction (caudate, lentiform nuclei, internal capsule, insula, M1-M3 cortex): 7 - Supraganglionic infarction (M4-M6  cortex): 2 Total score (0-10 with 10 being normal): 9 IMPRESSION: No acute intracranial hemorrhage. Possible small area of acute infarction at the junction of left superior and precentral gyri. However, this is contralateral to expected location for right-sided symptoms. Bilateral frontal hypodense lesions, which could reflect areas of encephalomalacia. However, there is no significant associated volume loss and metastatic disease is a consideration given apparent progression on recent cross-sectional imaging. These results were communicated to Dr. Leonel Ramsay at 1:41 pm on 07/31/2020 by text page via the [facility] messaging system. Electronically Signed: By: Macy Mis M.D. On: 07/31/2020 14:13        Scheduled Meds: . aspirin  300 mg Rectal Daily   Or  . aspirin  325 mg Oral Daily  . diphenhydrAMINE  50 mg Oral Once  . ezetimibe  10 mg Oral Daily  . fluticasone  2 spray Each Nare Daily  . insulin aspart  0-9 Units Subcutaneous Q4H  . metoprolol tartrate  12.5 mg Oral BID  . predniSONE  50 mg Oral Q6H  . thiamine injection  100 mg Intravenous Daily   Continuous Infusions: . sodium chloride    . sodium chloride Stopped (08/01/20 1741)  . heparin 700 Units/hr (08/02/20 0420)     LOS: 1 day   Time spent= 35 mins    Zarek Relph Arsenio Loader, MD [facility]  If 7PM-7AM, please contact night-coverage  08/02/2020, 8:18 AM

## 2020-08-02 NOTE — Progress Notes (Signed)
  Speech Language Pathology Treatment: Dysphagia  Patient Details Name: Jonathan Miranda MRN: [id number] DOB: 1951/09/25 Today's Date: 08/02/2020 Time: 1115-1130 SLP Time Calculation (min) (ACUTE ONLY): 15 min  Assessment / Plan / Recommendation Clinical Impression  Pt continues to exhibit decreased awareness of impairment, poor working memory and inability to follow safety precautions requiring 100% tactile cues to take a single sip at a time. Pt repeatedly took consecutive sips of nectar thick liquids if only verbal cues were given, resulting in immediate coughing. Reinforced need for full supervision with PO intake with RN. Pt with two friends at bedside, making direct therapy for cognitive impairment difficult. Will continue to follow for interventions. Recommend SNF.   HPI HPI: Jonathan Miranda is a 69 y.o. male with medical history significant of hypertension, hyperlipidemia, metastatic adenocarcinoma of the lung and bladder cancer seen in the emergency room for encephalopathy.  Code stroke called on patient arrival. HPi is limited due to pt being confused, he states he fell does not know when and how and when he woke up ems was there.Pt lives alone and per edmd note pt  was found on the floor and was last seen normal by friend who takes care of him.  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."      SLP Plan  Continue with current plan of care       Recommendations  Diet recommendations: Nectar-thick liquid Medication Administration: Crushed with puree                Oral Care Recommendations: Oral care BID Follow up Recommendations: Skilled Nursing facility SLP Visit Diagnosis: Dysphagia, oropharyngeal phase (R13.12) Plan: Continue with current plan of care       GO                Tawnya Pujol, Katherene Ponto 08/02/2020, 1:38 PM

## 2020-08-02 NOTE — Progress Notes (Signed)
ANTICOAGULATION CONSULT NOTE - Follow Up Consult  Pharmacy Consult for heparin Indication: PE in setting of acute CVA  Labs: Recent Labs    07/31/20 1323 07/31/20 1323 07/31/20 1327 08/02/20 0244  HGB 11.3*   < > 11.6* 10.6*  HCT 34.3*  --  34.0* 32.2*  PLT 294  --   --  228  APTT 27  --   --  43*  LABPROT 17.2*  --   --   --   INR 1.5*  --   --   --   HEPARINUNFRC  --   --   --  0.25*  CREATININE 1.38*  --  1.20  --    < > = values in this interval not displayed.    Assessment: 69yo male subtherapeutic on heparin with initial dosing while Xarelto on hold; no gtt issues or signs of bleeding per RN.  Goal of Therapy:  Heparin level 0.3-0.5 units/ml aPTT 66-85 seconds   Plan:  Will increase heparin gtt by 2 units/kg/hr to 700 units/hr and check level in 8 hours.    Wynona Neat, PharmD, BCPS  08/02/2020,4:08 AM

## 2020-08-03 LAB — APTT
aPTT: 70 seconds — ABNORMAL HIGH (ref 24–36)
aPTT: 57 seconds — ABNORMAL HIGH (ref 24–36)
aPTT: 76 seconds — ABNORMAL HIGH (ref 24–36)

## 2020-08-03 LAB — GLUCOSE, CAPILLARY
Glucose-Capillary: 94 mg/dL (ref 70–99)
Glucose-Capillary: 209 mg/dL — ABNORMAL HIGH (ref 70–99)
Glucose-Capillary: 196 mg/dL — ABNORMAL HIGH (ref 70–99)
Glucose-Capillary: 183 mg/dL — ABNORMAL HIGH (ref 70–99)
Glucose-Capillary: 156 mg/dL — ABNORMAL HIGH (ref 70–99)
Glucose-Capillary: 89 mg/dL (ref 70–99)

## 2020-08-03 LAB — CBC
HCT: 28.4 % — ABNORMAL LOW (ref 39.0–52.0)
Hemoglobin: 9.3 g/dL — ABNORMAL LOW (ref 13.0–17.0)
MCH: 28.3 pg (ref 26.0–34.0)
MCHC: 32.7 g/dL (ref 30.0–36.0)
MCV: 86.3 fL (ref 80.0–100.0)
Platelets: 214 10*3/uL (ref 150–400)
RBC: 3.29 MIL/uL — ABNORMAL LOW (ref 4.22–5.81)
RDW: 16 % — ABNORMAL HIGH (ref 11.5–15.5)
WBC: 18.3 10*3/uL — ABNORMAL HIGH (ref 4.0–10.5)
nRBC: 0.2 % (ref 0.0–0.2)

## 2020-08-03 LAB — HEPARIN LEVEL (UNFRACTIONATED)
Heparin Unfractionated: 0.45 IU/mL (ref 0.30–0.70)
Heparin Unfractionated: 0.5 IU/mL (ref 0.30–0.70)
Heparin Unfractionated: 0.55 IU/mL (ref 0.30–0.70)

## 2020-08-03 MED ORDER — INSULIN GLARGINE 100 UNIT/ML ~~LOC~~ SOLN
5.0000 [IU] | Freq: Every day | SUBCUTANEOUS | Status: DC
  Administered 2020-08-03 – 2020-08-06 (×4): 5 [IU] via SUBCUTANEOUS
  Filled 2020-08-03 (×4): qty 0.05

## 2020-08-03 NOTE — Progress Notes (Signed)
PROGRESS NOTE    Jonathan Miranda  WIO:[id number] DOB: 1951-06-19 DOA: 07/31/2020 PCP: Biagio Borg, MD   Brief Narrative:  69 y.o. male with medical history significant of hypertension, hyperlipidemia, metastatic adenocarcinoma of the lung and bladder cancer seen in the emergency room for encephalopathy.  The symptoms were very nonspecific.  EEG was unremarkable, CT head was negative but MRI showed multifocal CVA.  Neurology team was consulted.  Palliative care team was consulted fo goals of care discussion.  CT head with and without contrast multifocal CVA but no evidence of metastatic disease.  PT recommended SNF.   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 fib or hypercoagulable state from underlying malignancy. -CT head negative but MRI confirms CVA -CT head with contrast -again confirmed multifocal CVA but no evidence of metastatic disease. -Echocardiogram -EF 65 to 70%, grade 1 DD, moderate pericardial effusion -LDL-151, A1c-8.1 -Neurology consulted.  Permissive hypertension -We will convert IV heparin drip to p.o. anticoagulant upon discharge  Nonobstructive bilateral pulmonary embolism, diagnosed 07/19/2020 Mediastinal lymphadenopathy -Recent diagnosis, on outpatient Eliquis?  On heparin drip for now  Moderate pericardial effusion -No tamponade physiology but I suspect this is malignant in nature  Sinus tachycardia, improved -EKG-negative for A. fib which showed sinus tachycardia -Continue metoprolol 25 mg twice  daily  Essential hypertension -Permissive hypertension  Leukocytosis, chronic -Secondary to underlying malignancy and prednisone use.  Diabetes mellitus type 2 -Likely induced from steroids.  Insulin sliding/Accu-Chek. -Add Lantus 5 units daily  Hyperlipidemia -On Zetia  High-grade invasive bladder cancer Metastatic adenocarcinoma of the lung -Follows at [facility].  Given acute CVA, pulmonary embolism and advanced malignancy-palliative care team to see the patient today.  Speech recommending nectar thick liquid, medication administration crushed with pure.  Patient is adamant about going home but we have made it clear to him and his family that it is unsafe for him to return hom.  Patient's sister who is the next of kin and his friend both agree that it is unsafe for him to return home   DVT prophylaxis:  Heparin drip   Code Status: Full code Family Communication: Spoke with his sister  Dispo: The patient is from: Home              Anticipated d/c is to: SNF              Anticipated d/c date is: 1-2 days              Patient currently is not medically stable to d/c.  TOC team working on placement at [facility]   Body mass index is 16.16 kg/m.      Subjective: Sitting up in his bed, feels ok no complaints.  Alert to his name only.   Review of Systems Otherwise negative except as per HPI, including: General = no fevers, chills, dizziness,  fatigue HEENT/EYES = negative for loss of vision, double vision, blurred vision,  sore throa Cardiovascular= negative for chest pain, palpitation Respiratory/lungs= negative for shortness of breath, cough, wheezing; hemoptysis,  Gastrointestinal= negative  for nausea, vomiting, abdominal pain Genitourinary= negative for Dysuria MSK = Negative for arthralgia, myalgias Neurology= Negative for headache, numbness, tingling  Psychiatry= Negative for suicidal and homocidal ideation Skin= Negative for  Rash   Examination: Constitutional: Not in acute distress Respiratory: Clear to auscultation bilaterally Cardiovascular: Normal sinus rhythm, no rubs Abdomen: Nontender nondistended good bowel sounds Musculoskeletal: No edema noted Skin: No rashes seen Neurologic: Nonfocal exam.  Psychiatric: Poor judgment and insight. Alert and oriented  To his name only.    Objective: Vitals:   08/02/20 1616 08/02/20 1956 08/02/20 2328 08/03/20 0300  BP: 129/64 (!) 144/70 (!) 149/71 129/66  Pulse: 89 76 84 86  Resp: 19 18 19 18   Temp: 98.4 F (36.9 C) 98.2 F (36.8 C) 98.1 F (36.7 C) 98 F (36.7 C)  TempSrc: Oral Oral Oral Oral  SpO2: 100% 97% 96% 97%  Weight:      Height:        Intake/Output Summary (Last 24 hours) at 08/03/2020 0803 Last data filed at 08/03/2020 0600 Gross per 24 hour  Intake 1289.31 ml  Output 1025 ml  Net 264.31 ml   Filed Weights   07/31/20 1355 07/31/20 2059  Weight: 50.3 kg 46.8 kg     Data Reviewed:   CBC: Recent Labs  Lab 07/31/20 1323 07/31/20 1327 08/02/20 0244 08/03/20 0051  WBC 23.6*  --  18.6* 18.3*  NEUTROABS 18.5*  --   --   --   HGB 11.3* 11.6* 10.6* 9.3*  HCT 34.3* 34.0* 32.2* 28.4*  MCV 87.3  --  85.6 86.3  PLT 294  --  228 235   Basic Metabolic Panel: Recent Labs  Lab 07/31/20 1323 07/31/20 1327  NA 137 136  K 4.4 4.6  CL 102 103  CO2 19*  --   GLUCOSE 235* 235*  BUN 31* 39*  CREATININE 1.38* 1.20  CALCIUM 9.5  --    GFR: Estimated Creatinine Clearance: 37.4 mL/min (by C-G formula based on SCr of 1.2 mg/dL). Liver Function Tests: Recent Labs  Lab 07/31/20 1323  AST 36  ALT 20  ALKPHOS 176*  BILITOT 1.5*  PROT 6.0*  ALBUMIN 3.2*   No results for input(s): LIPASE, AMYLASE in the last 168 hours. No results for input(s): AMMONIA in the last 168 hours. Coagulation Profile: Recent Labs  Lab 07/31/20 1323  INR 1.5*   Cardiac Enzymes: No results for input(s): CKTOTAL, CKMB, CKMBINDEX, TROPONINI in the last  168 hours. BNP (last 3 results) No results for input(s): PROBNP in the last 8760 hours. HbA1C: Recent Labs    07/31/20 1755  HGBA1C 8.1*   CBG: Recent Labs  Lab 08/02/20 1701 08/02/20 1955 08/02/20 2335 08/03/20 0355 08/03/20 0729  GLUCAP 229* 250* 257* 94 209*   Lipid Profile: Recent Labs    08/01/20 0428  CHOL 235*  HDL 36*  LDLCALC 151*  TRIG 241*  CHOLHDL 6.5   Thyroid Function Tests: No results for input(s): TSH, T4TOTAL, FREET4, T3FREE, THYROIDAB in the last 72 hours. Anemia Panel: No results for input(s): VITAMINB12, FOLATE, FERRITIN, TIBC, IRON, RETICCTPCT in the last 72 hours. Sepsis Labs: No results for input(s): PROCALCITON, LATICACIDVEN in the last 168 hours.  Recent Results (from the past 240 hour(s))  Respiratory Panel by RT PCR (Flu A&B, Covid) - Nasopharyngeal Swab     Status: None   Collection Time: 07/31/20  4:39 PM   Specimen: Nasopharyngeal Swab  Result Value Ref Range Status   SARS Coronavirus 2 by RT  PCR NEGATIVE NEGATIVE Final    Comment: (NOTE) SARS-CoV-2 target nucleic acids are NOT DETECTED.  The SARS-CoV-2 RNA is generally detectable in upper respiratoy specimens during the acute phase of infection. The lowest concentration of SARS-CoV-2 viral copies this assay can detect is 131 copies/mL. A negative result does not preclude SARS-Cov-2 infection and should not be used as the sole basis for treatment or other patient management decisions. A negative result may occur with  improper specimen collection/handling, submission of specimen other than nasopharyngeal swab, presence of viral mutation(s) within the areas targeted by this assay, and inadequate number of viral copies (<131 copies/mL). A negative result must be combined with clinical observations, patient history, and epidemiological information. The expected result is Negative.  Fact Sheet for Patients:  PinkCheek.be  Fact Sheet for Healthcare  Providers:  GravelBags.it  This test is no t yet approved or cleared by the [place] FDA and  has been authorized for detection and/or diagnosis of SARS-CoV-2 by FDA under an Emergency Use Authorization (EUA). This EUA will remain  in effect (meaning this test can be used) for the duration of the COVID-19 declaration under Section 564(b)(1) of the Act, 21 U.S.C. section 360bbb-3(b)(1), unless the authorization is terminated or revoked sooner.     Influenza A by PCR NEGATIVE NEGATIVE Final   Influenza B by PCR NEGATIVE NEGATIVE Final    Comment: (NOTE) The Xpert Xpress SARS-CoV-2/FLU/RSV assay is intended as an aid in  the diagnosis of influenza from Nasopharyngeal swab specimens and  should not be used as a sole basis for treatment. Nasal washings and  aspirates are unacceptable for Xpert Xpress SARS-CoV-2/FLU/RSV  testing.  Fact Sheet for Patients: PinkCheek.be  Fact Sheet for Healthcare Providers: GravelBags.it  This test is not yet approved or cleared by the [place] FDA and  has been authorized for detection and/or diagnosis of SARS-CoV-2 by  FDA under an Emergency Use Authorization (EUA). This EUA will remain  in effect (meaning this test can be used) for the duration of the  Covid-19 declaration under Section 564(b)(1) of the Act, 21  U.S.C. section 360bbb-3(b)(1), unless the authorization is  terminated or revoked. Performed at [facility], [place] [address], [place], [place] [address]          Radiology Studies: CT HEAD W & WO CONTRAST  Result Date: 08/02/2020 CLINICAL DATA:  Stroke follow-up EXAM: CT HEAD WITHOUT AND WITH CONTRAST TECHNIQUE: Contiguous axial images were obtained from the base of the skull through the vertex without and with intravenous contrast CONTRAST:  77mL OMNIPAQUE IOHEXOL 300 MG/ML  SOLN COMPARISON:  CT 07/31/2020, degraded MRI 07/31/2020  FINDINGS: Brain: Small areas of hypoattenuation in the bilateral frontal lobes seen on the prior study as well as several new/better visualized small areas in the frontoparietal lobes, occipital lobes, and cerebellum. These correspond to areas of infarction on the interval MRI. There is no acute intracranial hemorrhage. No mass effect. No abnormal enhancement. Ventricles are stable in size. Vascular: No new finding. Skull: Calvarium is unremarkable. Sinuses/Orbits: No acute finding. Other: None. IMPRESSION: Multiple small acute infarcts as seen on recent MRI. There is no evidence of metastatic disease. Electronically Signed   By: Macy Mis M.D.   On: 08/02/2020 12:49   DG Swallowing Func-Speech Pathology  Result Date: 08/01/2020 Objective Swallowing Evaluation: Type of Study: MBS-Modified Barium Swallow Study  Patient Details Name: KAESYN JOHNSTON MRN: [id number] Date of Birth: [birth year] Today's Date: 08/01/2020 Time: SLP Start Time (ACUTE ONLY):  1332 -SLP Stop Time (ACUTE ONLY): 1349 SLP Time Calculation (min) (ACUTE ONLY): 17 min Past Medical History: Past Medical History: Diagnosis Date . Allergic rhinitis  . Aortic stenosis, mild 11/28/2015 . Back pain  . Cancer ([place])  . Hep A w/o coma  . Hyperlipidemia  . Hypertension  . Nephrolithiasis  . Personal history of colonic polyps  . Prostatitis  . Rash  . Tinnitus  Past Surgical History: Past Surgical History: Procedure Laterality Date . correction deviated septum   . ORIF wrist- '05   . ureteroscopyp   HPI: LOWERY PAULLIN is a 69 y.o. male with medical history significant of hypertension, hyperlipidemia, metastatic adenocarcinoma of the lung and bladder cancer seen in the emergency room for encephalopathy.  Code stroke called on patient arrival. HPi is limited due to pt being confused, he states he fell does not know when and how and when he woke up ems was there.Pt lives alone and per edmd note pt  was found on the floor and was last seen normal by  friend who takes care of him.  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."  Subjective: Pt awake, alert, pleasant, participative Assessment / Plan / Recommendation CHL IP CLINICAL IMPRESSIONS 08/01/2020 Clinical Impression Pt presents with oropharyngeal dysphagia characterized by reduced bolus propulsion, reduced lingual retraction, a pharyngeal delay, reduced anterior laryngeal moevement, and reduce pharyngeal constriction. He demonstrated impaired A-P transport, vallecular residue, pyriform sinus residue, posterior pharyngeal wall residue, penetration (PAS 3) and aspiration (PAS 7,8) of thin liquids and with consecutive swallows of nectar thick liquids via straw. Delayed throat clearing was inconsistently noted following aspiration and this was ineffective in expelling the aspirant. Prompted coughing was effective in expelling some of the aspirant from the airway. A chin tuck posture sucessfully eliminated aspiration of thin liqudis via cup, but pt exhibited difficulty consistently demonstrating this strategy. Amount of pharyngeal residue increased with bolus size and with more advanced consistencies. Residue was improved with liquid washes and pt's independent use of secondary swallows. A dysphagia 3 diet with nectar thick liquids is recommended at this time with observance of swallowing precautions. SLP will follow for dysphagia treatment.  SLP Visit Diagnosis Dysphagia, oropharyngeal phase (R13.12) Attention and concentration deficit following -- Frontal lobe and executive function deficit following -- Impact on safety and function Mild aspiration risk   CHL IP TREATMENT RECOMMENDATION 08/01/2020 Treatment Recommendations Defer until completion of intrumental exam   Prognosis 08/01/2020 Prognosis for Safe Diet Advancement Good Barriers to Reach Goals Cognitive deficits Barriers/Prognosis Comment -- CHL IP DIET RECOMMENDATION 08/01/2020 SLP Diet Recommendations  Dysphagia 3 (Mech soft) solids;Nectar thick liquid Liquid Administration via Cup;Straw Medication Administration Crushed with puree Compensations Slow rate;Small sips/bites;Follow solids with liquid Postural Changes Remain semi-upright after after feeds/meals (Comment)   CHL IP OTHER RECOMMENDATIONS 08/01/2020 Recommended Consults -- Oral Care Recommendations Oral care BID Other Recommendations --   CHL IP FOLLOW UP RECOMMENDATIONS 08/01/2020 Follow up Recommendations Skilled Nursing facility   [facility] IP FREQUENCY AND DURATION 08/01/2020 Speech Therapy Frequency (ACUTE ONLY) min 2x/week Treatment Duration 2 weeks      CHL IP ORAL PHASE 08/01/2020 Oral Phase Impaired Oral - Pudding Teaspoon -- Oral - Pudding Cup -- Oral - Honey Teaspoon -- Oral - Honey Cup -- Oral - Nectar Teaspoon -- Oral - Nectar Cup -- Oral - Nectar Straw -- Oral - Thin Teaspoon -- Oral - Thin Cup Reduced posterior propulsion Oral - Thin Straw -- Oral - Puree -- Oral -  Mech Soft -- Oral - Regular -- Oral - Multi-Consistency -- Oral - Pill -- Oral Phase - Comment --  CHL IP PHARYNGEAL PHASE 08/01/2020 Pharyngeal Phase Impaired Pharyngeal- Pudding Teaspoon -- Pharyngeal -- Pharyngeal- Pudding Cup -- Pharyngeal -- Pharyngeal- Honey Teaspoon -- Pharyngeal -- Pharyngeal- Honey Cup -- Pharyngeal -- Pharyngeal- Nectar Teaspoon -- Pharyngeal -- Pharyngeal- Nectar Cup Reduced tongue base retraction;Delayed swallow initiation-vallecula;Pharyngeal residue - pyriform;Pharyngeal residue - valleculae;Pharyngeal residue - posterior pharnyx;Reduced pharyngeal peristalsis Pharyngeal -- Pharyngeal- Nectar Straw Reduced tongue base retraction;Delayed swallow initiation-vallecula;Pharyngeal residue - pyriform;Pharyngeal residue - valleculae;Pharyngeal residue - posterior pharnyx;Reduced pharyngeal peristalsis;Penetration/Aspiration during swallow Pharyngeal Material enters airway, passes BELOW cords without attempt by patient to eject out (silent aspiration) Pharyngeal-  Thin Teaspoon -- Pharyngeal -- Pharyngeal- Thin Cup Reduced tongue base retraction;Delayed swallow initiation-vallecula;Pharyngeal residue - pyriform;Pharyngeal residue - valleculae;Pharyngeal residue - posterior pharnyx;Reduced pharyngeal peristalsis Pharyngeal -- Pharyngeal- Thin Straw Reduced tongue base retraction;Delayed swallow initiation-vallecula;Pharyngeal residue - pyriform;Pharyngeal residue - valleculae;Pharyngeal residue - posterior pharnyx;Reduced pharyngeal peristalsis Pharyngeal -- Pharyngeal- Puree Reduced tongue base retraction;Pharyngeal residue - pyriform;Pharyngeal residue - valleculae;Pharyngeal residue - posterior pharnyx;Reduced pharyngeal peristalsis Pharyngeal -- Pharyngeal- Mechanical Soft Reduced tongue base retraction;Pharyngeal residue - pyriform;Pharyngeal residue - valleculae;Pharyngeal residue - posterior pharnyx;Reduced pharyngeal peristalsis Pharyngeal -- Pharyngeal- Regular Reduced tongue base retraction;Pharyngeal residue - pyriform;Pharyngeal residue - valleculae;Pharyngeal residue - posterior pharnyx;Reduced pharyngeal peristalsis Pharyngeal -- Pharyngeal- Multi-consistency -- Pharyngeal -- Pharyngeal- Pill Reduced tongue base retraction;Delayed swallow initiation-vallecula;Pharyngeal residue - pyriform;Pharyngeal residue - valleculae;Pharyngeal residue - posterior pharnyx;Reduced pharyngeal peristalsis Pharyngeal -- Pharyngeal Comment --  CHL IP CERVICAL ESOPHAGEAL PHASE 08/01/2020 Cervical Esophageal Phase WFL Pudding Teaspoon -- Pudding Cup -- Honey Teaspoon -- Honey Cup -- Nectar Teaspoon -- Nectar Cup -- Nectar Straw -- Thin Teaspoon -- Thin Cup -- Thin Straw -- Puree -- Mechanical Soft -- Regular -- Multi-consistency -- Pill -- Cervical Esophageal Comment -- Shanika I. Hardin Negus, [place], [place] Office number [phone] Pager [phone] Horton [name] 08/01/2020, 2:49 PM              ECHOCARDIOGRAM COMPLETE  Result Date: 08/01/2020     ECHOCARDIOGRAM REPORT   Patient Name:   CHIRSTOPHER IOVINO Date of Exam: 08/01/2020 Medical Rec #:  [id number]           Height:       67.0 in Accession #:    [id number]          Weight:       103.2 lb Date of Birth:  09-28-1951           BSA:          1.526 m Patient Age:    74 years            BP:           110/78 mmHg Patient Gender: M                   HR:           112 bpm. Exam Location:  Inpatient Procedure: 2D Echo Indications:    stroke 434.91  History:        Patient has prior history of Echocardiogram examinations, most                 recent 11/27/2015. Stroke; Risk Factors:Diabetes, Dyslipidemia                 and Hypertension.  Sonographer:    Johny Chess Referring Phys: [id number] HWEX V  PATEL  Sonographer Comments: Image acquisition challenging due to uncooperative patient and Image acquisition challenging due to respiratory motion. IMPRESSIONS  1. Left ventricular ejection fraction, by estimation, is 65 to 70%. The left ventricle has hyperdynamic function. The left ventricle has no regional wall motion abnormalities. Left ventricular diastolic parameters are consistent with Grade I diastolic dysfunction (impaired relaxation).  2. Right ventricular systolic function is normal. The right ventricular size is normal. Tricuspid regurgitation signal is inadequate for assessing PA pressure.  3. Moderate pericardial effusion, primarily along the RV free wall. < 25% respirophasic variation in the mitral valve E inflow doppler velocity, small IVC collapses normally, no RV diastolic collapse. No evidence for tamponade.  4. The mitral valve is normal in structure. No evidence of mitral valve regurgitation. No evidence of mitral stenosis. Moderate mitral annular calcification.  5. The aortic valve is tricuspid. Aortic valve regurgitation is mild. Moderate aortic valve stenosis. Aortic valve area, by VTI measures 1.15 cm. Aortic valve mean gradient measures 22.0 mmHg.  6. The inferior vena cava is normal in  size with greater than 50% respiratory variability, suggesting right atrial pressure of 3 mmHg. FINDINGS  Left Ventricle: Left ventricular ejection fraction, by estimation, is 65 to 70%. The left ventricle has hyperdynamic function. The left ventricle has no regional wall motion abnormalities. The left ventricular internal cavity size was normal in size. There is no left ventricular hypertrophy. Left ventricular diastolic parameters are consistent with Grade I diastolic dysfunction (impaired relaxation). Right Ventricle: The right ventricular size is normal. No increase in right ventricular wall thickness. Right ventricular systolic function is normal. Tricuspid regurgitation signal is inadequate for assessing PA pressure. Left Atrium: Left atrial size was normal in size. Right Atrium: Right atrial size was normal in size. Pericardium: A moderately sized pericardial effusion is present. Mitral Valve: The mitral valve is normal in structure. There is mild calcification of the mitral valve leaflet(s). Moderate mitral annular calcification. No evidence of mitral valve regurgitation. No evidence of mitral valve stenosis. Tricuspid Valve: The tricuspid valve is normal in structure. Tricuspid valve regurgitation is not demonstrated. Aortic Valve: The aortic valve is tricuspid. Aortic valve regurgitation is mild. Aortic regurgitation PHT measures 224 msec. Moderate aortic stenosis is present. Aortic valve mean gradient measures 22.0 mmHg. Aortic valve peak gradient measures 35.9 mmHg. Aortic valve area, by VTI measures 1.15 cm. Pulmonic Valve: The pulmonic valve was normal in structure. Pulmonic valve regurgitation is not visualized. Aorta: The aortic root is normal in size and structure. Venous: The inferior vena cava is normal in size with greater than 50% respiratory variability, suggesting right atrial pressure of 3 mmHg. IAS/Shunts: No atrial level shunt detected by color flow Doppler.  LEFT VENTRICLE PLAX 2D LVIDd:          4.20 cm  Diastology LVIDs:         3.00 cm  LV e' medial:  6.09 cm/s LV PW:         0.80 cm  LV e' lateral: 7.51 cm/s LV IVS:        0.80 cm LVOT diam:     1.80 cm LV SV:         50 LV SV Index:   33 LVOT Area:     2.54 cm  RIGHT VENTRICLE             IVC RV S prime:     16.20 cm/s  IVC diam: 1.10 cm TAPSE (M-mode): 2.3 cm LEFT ATRIUM  Index       RIGHT ATRIUM          Index LA diam:        2.90 cm 1.90 cm/m  RA Area:     8.20 cm LA Vol (A2C):   26.4 ml 17.30 ml/m RA Volume:   15.60 ml 10.22 ml/m LA Vol (A4C):   30.6 ml 20.05 ml/m LA Biplane Vol: 29.7 ml 19.46 ml/m  AORTIC VALVE AV Area (Vmax):    0.99 cm AV Area (Vmean):   1.06 cm AV Area (VTI):     1.15 cm AV Vmax:           299.50 cm/s AV Vmean:          203.500 cm/s AV VTI:            0.435 m AV Peak Grad:      35.9 mmHg AV Mean Grad:      22.0 mmHg LVOT Vmax:         116.50 cm/s LVOT Vmean:        84.600 cm/s LVOT VTI:          0.197 m LVOT/AV VTI ratio: 0.45 AI PHT:            224 msec  AORTA Ao Root diam: 2.80 cm  SHUNTS Systemic VTI:  0.20 m Systemic Diam: 1.80 cm Loralie Champagne MD Electronically signed by Loralie Champagne MD Signature Date/Time: 08/01/2020/3:14:23 PM    Final         Scheduled Meds: . aspirin  300 mg Rectal Daily   Or  . aspirin  325 mg Oral Daily  . ezetimibe  10 mg Oral Daily  . fluticasone  2 spray Each Nare Daily  . insulin aspart  0-9 Units Subcutaneous Q4H  . metoprolol tartrate  12.5 mg Oral BID  . thiamine injection  100 mg Intravenous Daily   Continuous Infusions: . sodium chloride    . sodium chloride 50 mL/hr at 08/02/20 1611  . heparin 800 Units/hr (08/03/20 0555)     LOS: 2 days   Time spent= 35 mins    Vearl Aitken Arsenio Loader, MD [facility]  If 7PM-7AM, please contact night-coverage  08/03/2020, 8:03 AM

## 2020-08-03 NOTE — Progress Notes (Signed)
ANTICOAGULATION CONSULT NOTE - Follow Up Consult  Pharmacy Consult for heparin Indication: PE in setting of acute CVA  Labs: Recent Labs    07/31/20 1323 07/31/20 1323 07/31/20 1327 07/31/20 1327 08/02/20 0244 08/02/20 1143 08/03/20 0051  HGB 11.3*   < > 11.6*   < > 10.6*  --  9.3*  HCT 34.3*   < > 34.0*  --  32.2*  --  28.4*  PLT 294  --   --   --  228  --  214  APTT 27  --   --    < > 43* 47* 70*  LABPROT 17.2*  --   --   --   --   --   --   INR 1.5*  --   --   --   --   --   --   HEPARINUNFRC  --   --   --   --  0.25* 0.35 0.45  CREATININE 1.38*  --  1.20  --   --   --   --    < > = values in this interval not displayed.    Assessment/Plan:  69yo male therapeutic on heparin after rate chances. Will continue gtt at current rate and confirm stable with additional level.   Wynona Neat, PharmD, BCPS  08/03/2020,1:18 AM

## 2020-08-03 NOTE — Progress Notes (Signed)
ANTICOAGULATION CONSULT NOTE - Initial Consult  Pharmacy Consult for Heparin Indication: pulmonary embolus   Allergies  Allergen Reactions   Zolpidem Anaphylaxis    Mother and sister had hallucinations to Ambien. Patient will decline taking this due family history.    Statins Hives   Iodine Hives   Penicillins Hives   Shellfish Allergy Hives    Patient Measurements: Height: 5\' 7"  (170.2 cm) Weight: 46.8 kg (103 lb 2.8 oz) IBW/kg (Calculated) : 66.1 Heparin Dosing Weight: 46.8 kg  Vital Signs: Temp: 98.2 F (36.8 C) (10/30 0816) Temp Source: Oral (10/30 0816) BP: 114/61 (10/30 0816) Pulse Rate: 92 (10/30 0816)  Labs: Recent Labs    07/31/20 1323 07/31/20 1323 07/31/20 1327 07/31/20 1327 08/02/20 0244 08/02/20 0244 08/02/20 1143 08/03/20 0051 08/03/20 0817  HGB 11.3*   < > 11.6*   < > 10.6*  --   --  9.3*  --   HCT 34.3*   < > 34.0*  --  32.2*  --   --  28.4*  --   PLT 294  --   --   --  228  --   --  214  --   APTT 27  --   --    < > 43*   < > 47* 70* 57*  LABPROT 17.2*  --   --   --   --   --   --   --   --   INR 1.5*  --   --   --   --   --   --   --   --   HEPARINUNFRC  --   --   --   --  0.25*   < > 0.35 0.45 0.50  CREATININE 1.38*  --  1.20  --   --   --   --   --   --    < > = values in this interval not displayed.    Estimated Creatinine Clearance: 37.4 mL/min (by C-G formula based on SCr of 1.2 mg/dL).   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  New CVA on admit 10/26.  Outpatient notes indicate that he was given Xarelto starter pack on 07/19/20.  Upon prompting, patient reports that he forgot to mention that  medication on admit, but has been taking it.  Last dose reported 07/30/20, unknown time.  Plan no heparin boluses with new CVA, and low therapeutic goals.  Since recent Xarelto may falsely elevate heparin levels, will check aPTTs and heparin levels until they correlate.  08/03/20 0817 APTT subtherapeutic at 57 with heparin infusing at 800 units/hr APTT and HL not yet correlating  Goal of Therapy:  Heparin level 0.3-0.5 units/ml aPTT 66-85 seconds Monitor platelets by anticoagulation protocol: Yes   Plan:   Increase heparin drip to 900 units/hr (19.2 units/kg/hour)  Heparin level and aPTT ~8 hrs after rate change  Daily heparin level and aPTT until correlating; daily CBC.  Xarelto on hold.  Finesse Fielder P. Legrand Como, PharmD, [place] Please  utilize Amion for appropriate phone number to reach the unit pharmacist ([place]) 08/03/2020 10:04 AM

## 2020-08-03 NOTE — Progress Notes (Signed)
Palliative Medicine Inpatient Follow Up Note  Reason for consult:  Goals of Care "[place].  Multiple advanced co-morbidities"  HPI:  Per intake H&P --> Jonathan Miranda a 69 y.o.malewith medical history significant ofhypertension, hyperlipidemia, metastatic adenocarcinoma of the lung and bladder cancer seen in the emergency room for encephalopathy. Code stroke called on patient arrival.No family at bedside. HPi is limited due to pt being confused, he states he fell does not know when and how and when he woke up ems was there.Pt lives alone and per edmd note pt was found on the floor and was last seen normal by friend who takes care of him yesterday.  Palliative care was asked to get involved to aid in goals of care conversations in the setting of metastatic adenocarcinoma of the lung and bladder cancer among other comorbidities.   Today's Discussion (08/03/2020): Chart reviewed. I met with Jonathan Miranda at bedside. He is in better spirits today and is not perseverating over going home. Jonathan Miranda does recognize the need for physical rehabilitation today though he did not recall getting out of bed yesterday. We again talked about advanced care planning and the need to align his wishes up given his present diagnosis. He was in agreement and stated that he has some things he still needs to "think about". He believes that his friend Jonathan Miranda and his sister Jonathan Miranda will be able to work well with one another regarding his estate. I again shared with him that we do not do estate planning - we only talk about advance directives as they relate to patients health. He was thankful for this information though admits to being forgetful and states he will most likely forget it. Otherwise he does not endorse any significant concerns.    Discussed the importance of continued conversation with family and their  medical providers regarding overall plan of care and treatment options, ensuring decisions are within the  context of the patients values and GOCs.  Questions and concerns addressed   Objective Assessment: Vital Signs Vitals:   08/03/20 0816 08/03/20 1130  BP: 114/61 (!) 128/56  Pulse: 92 76  Resp: 18 16  Temp: 98.2 F (36.8 C) 98.5 F (36.9 C)  SpO2: 100% 97%    Intake/Output Summary (Last 24 hours) at 08/03/2020 1317 Last data filed at 08/03/2020 1303 Gross per 24 hour  Intake 1589.31 ml  Output 1025 ml  Net 564.31 ml   Last Weight  Most recent update: 07/31/2020  8:59 PM   Weight  46.8 kg (103 lb 2.8 oz)           Gen:  NAD HEENT: moist mucous membranes CV: Regular rate and rhythm, no murmurs rubs or gallops PULM: clear to auscultation bilaterally. No wheezes/rales/rhonchi ABD: soft/nontender/nondistended/normal bowel sounds EXT: No edema Neuro: Alert and oriented x2-3 - intermittently confused  SUMMARY OF RECOMMENDATIONS DNAR/DNI  MOST Completed, paper copy placed onto the chart electric copy can be found in [facility]yn[facility]  DNR Form Completed, paper copy placed onto the chart electric copy can be found in Vyn[facility]  Treat what is treatable  TOC - OP Palliative and information on hospice services  Chaplain-Advanced directives  Palliative care will remains peripherally involved - please contact us for any immanent needs  Time Spent: 25 Greater than 50% of the time was spent in counseling and coordination of care ______________________________________________________________________________________ [place] Team Team Cell Phone: [phone] Please utilize secure chat with additional questions, if there is no response within 30 minutes  please call the above phone number  Palliative Medicine Team providers are available by phone from 7am to 7pm daily and can be reached through the team cell phone.  Should this patient require assistance outside of these hours, please call the patient's attending physician.

## 2020-08-03 NOTE — Progress Notes (Signed)
[place] for Heparin Indication: pulmonary embolus   Allergies  Allergen Reactions  . Zolpidem Anaphylaxis    Mother and sister had hallucinations to Ambien. Patient will decline taking this due family history.   . Statins Hives  . Iodine Hives  . Penicillins Hives  . Shellfish Allergy Hives    Patient Measurements: Height: 5\' 7"  (170.2 cm) Weight: 46.8 kg (103 lb 2.8 oz) IBW/kg (Calculated) : 66.1 Heparin Dosing Weight: 46.8 kg  Vital Signs: Temp: 98.4 F (36.9 C) (10/30 1618) Temp Source: Oral (10/30 1618) BP: 131/70 (10/30 1618) Pulse Rate: 85 (10/30 1618)  Labs: Recent Labs    08/02/20 0244 08/02/20 1143 08/03/20 0051 08/03/20 0817 08/03/20 1754  HGB 10.6*  --  9.3*  --   --   HCT 32.2*  --  28.4*  --   --   PLT 228  --  214  --   --   APTT 43*   < > 70* 57* 76*  HEPARINUNFRC 0.25*   < > 0.45 0.50 0.55   < > = values in this interval not displayed.    Estimated Creatinine Clearance: 37.4 mL/min (by C-G formula based on SCr of 1.2 mg/dL).   Medical History: Past Medical History:  Diagnosis Date  . Allergic rhinitis   . Aortic stenosis, mild 11/28/2015  . Back pain   . Cancer ([place])   . Hep A w/o coma   . Hyperlipidemia   . Hypertension   . Nephrolithiasis   . Personal history of colonic polyps   . Prostatitis   . Rash   . Tinnitus    Assessment:  69 yr old male with hx PE 07/19/20 to begin IV heparin.  New CVA on admit 10/26.  Outpatient notes indicate that he was given Xarelto starter pack on 07/19/20.  Upon prompting, patient reports that he forgot to mention that medication on admit, but has been taking it.  Last dose reported 07/30/20, unknown time.  Heparin level slightly supratherapeutic with lower goals, last dose Xarelto 4d ago and labs appear correlated, will follow anti-Xa levels from now on.    Goal of Therapy:  Heparin level 0.3-0.5 units/ml aPTT 66-85 seconds Monitor platelets by  anticoagulation protocol: Yes   Plan:  Decrease heparin gtt to 850 units/hr F/u heparin level with AM labs to confirm  Bertis Ruddy, PharmD Clinical Pharmacist ED Pharmacist Phone # [phone] 08/03/2020 7:34 PM

## 2020-08-03 NOTE — TOC Progression Note (Addendum)
Transition of Care [facility]) - Progression Note    Patient Details  Name: Jonathan Miranda MRN: [id number] Date of Birth: [birth year]  Transition of Care [facility]) CM/SW [place], [place] Phone Number: 08/03/2020, 1:34 PM  Clinical Narrative:    CSW met with pt at bedside, presented offers. Pt stated he is interested in [facility] in [place], but would look at the current offers as a backup, CSW will send out referral. Pt states he will make decision on SNF placement, there is no need to consult with any other family.    Expected Discharge Plan: [place] Barriers to Discharge: Continued Medical Work up  Expected Discharge Plan and Services Expected Discharge Plan: [place] In-house Referral: Clinical Social Work Discharge Planning Services: CM Consult Post Acute Care Choice: [place] arrangements for the past 2 months: Single Family Home                                       Social Determinants of Health (SDOH) Interventions    Readmission Risk Interventions No flowsheet data found.

## 2020-08-04 LAB — CBC
HCT: 27.1 % — ABNORMAL LOW (ref 39.0–52.0)
Hemoglobin: 8.7 g/dL — ABNORMAL LOW (ref 13.0–17.0)
MCH: 28.6 pg (ref 26.0–34.0)
MCHC: 32.1 g/dL (ref 30.0–36.0)
MCV: 89.1 fL (ref 80.0–100.0)
Platelets: 231 10*3/uL (ref 150–400)
RBC: 3.04 MIL/uL — ABNORMAL LOW (ref 4.22–5.81)
RDW: 16.7 % — ABNORMAL HIGH (ref 11.5–15.5)
WBC: 14.1 10*3/uL — ABNORMAL HIGH (ref 4.0–10.5)
nRBC: 0.1 % (ref 0.0–0.2)

## 2020-08-04 LAB — BASIC METABOLIC PANEL
Anion gap: 5 (ref 5–15)
BUN: 13 mg/dL (ref 8–23)
CO2: 26 mmol/L (ref 22–32)
Calcium: 8.6 mg/dL — ABNORMAL LOW (ref 8.9–10.3)
Chloride: 109 mmol/L (ref 98–111)
Creatinine, Ser: 0.87 mg/dL (ref 0.61–1.24)
GFR, Estimated: >60 mL/min (ref >60)
Glucose, Bld: 129 mg/dL — ABNORMAL HIGH (ref 70–99)
Potassium: 3.2 mmol/L — ABNORMAL LOW (ref 3.5–5.1)
Sodium: 140 mmol/L (ref 135–145)

## 2020-08-04 LAB — GLUCOSE, CAPILLARY
Glucose-Capillary: 160 mg/dL — ABNORMAL HIGH (ref 70–99)
Glucose-Capillary: 172 mg/dL — ABNORMAL HIGH (ref 70–99)
Glucose-Capillary: 205 mg/dL — ABNORMAL HIGH (ref 70–99)
Glucose-Capillary: 221 mg/dL — ABNORMAL HIGH (ref 70–99)
Glucose-Capillary: 68 mg/dL — ABNORMAL LOW (ref 70–99)
Glucose-Capillary: 77 mg/dL (ref 70–99)
Glucose-Capillary: 94 mg/dL (ref 70–99)

## 2020-08-04 LAB — HEPARIN LEVEL (UNFRACTIONATED): Heparin Unfractionated: 0.48 IU/mL (ref 0.30–0.70)

## 2020-08-04 LAB — MAGNESIUM: Magnesium: 1.8 mg/dL (ref 1.7–2.4)

## 2020-08-04 MED ORDER — POTASSIUM CHLORIDE 20 MEQ/15ML (10%) PO SOLN
40.0000 meq | Freq: Every day | ORAL | Status: DC
  Administered 2020-08-04 – 2020-08-06 (×3): 40 meq via ORAL
  Filled 2020-08-04 (×3): qty 30

## 2020-08-04 MED ORDER — RESOURCE THICKENUP CLEAR PO POWD
ORAL | Status: DC | PRN
  Filled 2020-08-04: qty 125

## 2020-08-04 MED ORDER — RIVAROXABAN 20 MG PO TABS: 20.0000 mg | ORAL_TABLET | Freq: Every day | ORAL | Status: DC

## 2020-08-04 MED ORDER — RIVAROXABAN 15 MG PO TABS
15.0000 mg | ORAL_TABLET | Freq: Two times a day (BID) | ORAL | Status: DC
  Administered 2020-08-04: 15 mg via ORAL
  Filled 2020-08-04: qty 1

## 2020-08-04 MED ORDER — RIVAROXABAN 15 MG PO TABS
15.0000 mg | ORAL_TABLET | Freq: Two times a day (BID) | ORAL | Status: DC
  Administered 2020-08-04 – 2020-08-06 (×4): 15 mg via ORAL
  Filled 2020-08-04 (×4): qty 1

## 2020-08-04 NOTE — Progress Notes (Signed)
[place] for Heparin Indication: pulmonary embolus   Allergies  Allergen Reactions  . Zolpidem Anaphylaxis    Mother and sister had hallucinations to Ambien. Patient will decline taking this due family history.   . Statins Hives  . Iodine Hives  . Penicillins Hives  . Shellfish Allergy Hives    Patient Measurements: Height: 5\' 7"  (170.2 cm) Weight: 46.8 kg (103 lb 2.8 oz) IBW/kg (Calculated) : 66.1 Heparin Dosing Weight: 46.8 kg  Vital Signs: Temp: 99.7 F (37.6 C) (10/31 0323) Temp Source: Oral (10/31 0323) BP: 134/61 (10/31 0323) Pulse Rate: 59 (10/31 0323)  Labs: Recent Labs    08/02/20 0244 08/02/20 1143 08/03/20 0051 08/03/20 0051 08/03/20 0817 08/03/20 1754 08/04/20 0221  HGB 10.6*  --  9.3*  --   --   --  8.7*  HCT 32.2*  --  28.4*  --   --   --  27.1*  PLT 228  --  214  --   --   --  231  APTT 43*   < > 70*  --  57* 76*  --   HEPARINUNFRC 0.25*   < > 0.45   < > 0.50 0.55 0.48  CREATININE  --   --   --   --   --   --  0.87   < > = values in this interval not displayed.    Estimated Creatinine Clearance: 51.6 mL/min (by C-G formula based on SCr of 0.87 mg/dL).   Medical History: Past Medical History:  Diagnosis Date  . Allergic rhinitis   . Aortic stenosis, mild 11/28/2015  . Back pain   . Cancer ([place])   . Hep A w/o coma   . Hyperlipidemia   . Hypertension   . Nephrolithiasis   . Personal history of colonic polyps   . Prostatitis   . Rash   . Tinnitus    Assessment:  68 yr old male with hx PE 07/19/20 to begin IV heparin.  New CVA on admit 10/26.  Outpatient notes indicate that he was given Xarelto starter pack on 07/19/20.  Upon prompting, patient reports that he forgot to mention that medication on admit, but has been taking it.  Last dose reported 07/30/20, unknown time.  08/04/20 AM: Heparin level therapeutic (0.48) using lower goals with heparin infusing at 850 units/hr, last dose Xarelto 4d ago and  labs correlated on 10/30, now following only anti-Xa levels.  No bleeding reported.  Goal of Therapy:  Heparin level 0.3-0.5 units/ml Monitor platelets by anticoagulation protocol: Yes   Plan:  Continue heparin infusion at 850 units/hr Monitor daily heparin level, CBC, s/s bleeding   [place] P. Legrand Como, PharmD, [place] Please utilize Amion for appropriate phone number to reach the unit pharmacist ([place]) 08/04/2020 7:33 AM

## 2020-08-04 NOTE — Progress Notes (Addendum)
  ANTICOAGULATION CONSULT NOTE - Initial Consult  Pharmacy Consult for rivaroxaban (Xarelto) Indication: pulmonary embolus  Allergies  Allergen Reactions  . Zolpidem Anaphylaxis    Mother and sister had hallucinations to Ambien. Patient will decline taking this due family history.   . Statins Hives  . Iodine Hives  . Penicillins Hives  . Shellfish Allergy Hives    Patient Measurements: Height: 5\' 7"  (170.2 cm) Weight: 46.8 kg (103 lb 2.8 oz) IBW/kg (Calculated) : 66.1  Vital Signs: Temp: 99.7 F (37.6 C) (10/31 0323) Temp Source: Oral (10/31 0323) BP: 134/61 (10/31 0323) Pulse Rate: 59 (10/31 0323)  Labs: Recent Labs    08/02/20 0244 08/02/20 1143 08/03/20 0051 08/03/20 0051 08/03/20 0817 08/03/20 1754 08/04/20 0221  HGB 10.6*  --  9.3*  --   --   --  8.7*  HCT 32.2*  --  28.4*  --   --   --  27.1*  PLT 228  --  214  --   --   --  231  APTT 43*   < > 70*  --  57* 76*  --   HEPARINUNFRC 0.25*   < > 0.45   < > 0.50 0.55 0.48  CREATININE  --   --   --   --   --   --  0.87   < > = values in this interval not displayed.    Estimated Creatinine Clearance: 51.6 mL/min (by C-G formula based on SCr of 0.87 mg/dL).   Medical History: Past Medical History:  Diagnosis Date  . Allergic rhinitis   . Aortic stenosis, mild 11/28/2015  . Back pain   . Cancer ([place])   . Hep A w/o coma   . Hyperlipidemia   . Hypertension   . Nephrolithiasis   . Personal history of colonic polyps   . Prostatitis   . Rash   . Tinnitus     Medications:  Outpatient notes indicate that he was given Xarelto starter pack on 07/19/20.  Upon prompting, patient reports that he forgot to mention that medication on admit, but had been taking it.  Last dose reported 07/30/20, unknown time.  Assessment: 69 yo with hx PE 07/19/20 (on Xarelto PTA that started on 10/15) initiated on IV heparin infusion on 08/01/20.  New CVA on admit 10/26.  Heparin currently infusing at 850 units/hr, APTT & HL had  correlated 10/30 meaning effects of Xarelto had diminished.    Goal of Therapy:  Pulmonary embolism treatment Monitor platelets by anticoagulation protocol: Yes   Plan:  Discontinue heparin infusion and daily heparin levels Xarelto 15 mg twice daily with food for 5 more days followed by 20 mg once daily with food Monitor CBC, s/s bleeding  Melba Araki P. Legrand Como, PharmD, [place] Please utilize Amion for appropriate phone number to reach the unit pharmacist ([place]) 08/04/2020 9:18 AM

## 2020-08-04 NOTE — Progress Notes (Signed)
 Palliative Medicine Inpatient Follow Up Note  Reason for consult:  Goals of Care "[place].  Multiple advanced co-morbidities"  HPI:  Per intake H&P --> Jonathan Mirandais a 69 y.o.malewith medical history significant ofhypertension, hyperlipidemia, metastatic adenocarcinoma of the lung and bladder cancer seen in the emergency room for encephalopathy. Code stroke called on patient arrival.No family at bedside. HPi is limited due to pt being confused, he states he fell does not know when and how and when he woke up ems was there.Pt lives alone and per edmd note pt was found on the floor and was last seen normal by friend who takes care of him yesterday.  Palliative care was asked to get involved to aid in goals of care conversations in the setting of metastatic adenocarcinoma of the lung and bladder cancer among other comorbidities.   Today's Discussion (08/04/2020): Chart reviewed. I met with [name] at bedside though he was noted at this time to be resting. Per bedside RN, Melissa patient is adamant about wanting to go home again today.   I went back to assess Jonathan Miranda later in the morning.  He was noted to be awake and in the bedside chair.  I shared with him that at this juncture he is not safe to go home and does need to work on strengthening.  We talked about the reality that he may not go home given that he needs additional care and support.  He again shares with me that his goal is to be with his dog.  Norris and I did discuss his cancer and its metastatic nature.  I did share with him that I worry his time is more limited.  He does state understanding of this.  He is still amenable to outpatient palliative support, though, I suspect at some point he will absolutely need transition to hospice.  I spoke to patients sister, Jonathan Miranda this morning.  Jonathan Miranda states that she and Jonathan Miranda's friend Jonathan Miranda have been playing phone tag though they are trying to find a date and time to discuss his  advanced directives in completing those more formally.  Jonathan Miranda states that she had been working with Jonathan Miranda of the transition of care team to find long-term placement for Jonathan Miranda after skilled nursing.  She is hopeful that an institution can be found that allows dogs.  We did discuss how important Jonathan Miranda's dog is to him and that this will really make a difference in his life and his overall quality of life.  We talked about Jonathan Miranda's prognosis being quite poor which she and her Sister Jonathan Miranda are aware of.   Discussed the importance of continued conversation with family and their  medical providers regarding overall plan of care and treatment options, ensuring decisions are within the context of the patients values and GOCs.  Questions and concerns addressed   Objective Assessment: Vital Signs Vitals:   08/03/20 2335 08/04/20 0323  BP: (!) 103/51 134/61  Pulse: (!) 58 (!) 59  Resp: 16 16  Temp: 98.3 F (36.8 C) 99.7 F (37.6 C)  SpO2: 99% 100%    Intake/Output Summary (Last 24 hours) at 08/04/2020 0902 Last data filed at 08/04/2020 0611 Gross per 24 hour  Intake 150 ml  Output 1900 ml  Net -1750 ml   Last Weight  Most recent update: 07/31/2020  8:59 PM   Weight  46.8 kg (103 lb 2.8 oz)           Gen:  NAD HEENT: moist mucous membranes CV:   Regular rate and rhythm, no murmurs rubs or gallops PULM: clear to auscultation bilaterally. No wheezes/rales/rhonchi ABD: soft/nontender/nondistended/normal bowel sounds EXT: No edema Neuro: Alert and oriented x2-3 - intermittently confused  SUMMARY OF RECOMMENDATIONS DNAR/DNI  MOST Completed, paper copy placed onto the chart electric copy can be found in Vynca  DNR Form Completed, paper copy placed onto the chart electric copy can be found in Vynca  Treat what is treatable  OP Palliative - reached out to [place] directives  Palliative care will remain peripherally involved - please contact us for any  immanent needs  Time Spent: 35 Greater than 50% of the time was spent in counseling and coordination of care ______________________________________________________________________________________ [place] Team Team Cell Phone: [phone] Please utilize secure chat with additional questions, if there is no response within 30 minutes please call the above phone number  Palliative Medicine Team providers are available by phone from 7am to 7pm daily and can be reached through the team cell phone.  Should this patient require assistance outside of these hours, please call the patient's attending physician.

## 2020-08-04 NOTE — Progress Notes (Signed)
Manufacturing engineer [facility])  Hospital Liaison RN note         Notified by [facility] manager of patient/family request for [facility] Palliative services at home after discharge. [place] Palliative team will follow up with patient after discharge.         Please call with any hospice or palliative related questions.         Thank you for the opportunity to participate in this patient's care.     Chrislyn Edison Pace, BSN, RN [place] (listed on [place] under Hospice/[facility])    [phone]

## 2020-08-04 NOTE — Progress Notes (Signed)
PROGRESS NOTE    Jonathan Miranda  OEU:[id number] DOB: 03/31/1951 DOA: 07/31/2020 PCP: Biagio Borg, MD   Brief Narrative:  69 y.o. male with medical history significant of hypertension, hyperlipidemia, metastatic adenocarcinoma of the lung and bladder cancer seen in the emergency room for encephalopathy.  The symptoms were very nonspecific.  EEG was unremarkable, CT head was negative but MRI showed multifocal CVA.  Neurology team was consulted.  Palliative care team was consulted fo goals of care discussion.  CT head with and without contrast multifocal CVA but no evidence of metastatic disease.  PT recommended SNF.   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 fib or hypercoagulable state from underlying malignancy. -CT head negative but MRI confirms CVA -CT head with contrast -again confirmed multifocal CVA but no evidence of metastatic disease. -Echocardiogram -EF 65 to 70%, grade 1 DD, moderate pericardial effusion -LDL-151, A1c-8.1 -Neurology consulted.  Permissive hypertension -Transition from heparin drip to p.o. home Xarelto  Nonobstructive bilateral pulmonary embolism, diagnosed 07/19/2020 Mediastinal lymphadenopathy -Transition from heparin drip to Xarelto  Moderate pericardial effusion -No tamponade physiology but I suspect this is malignant in nature  Sinus tachycardia, improved -EKG-negative for A. fib which showed sinus tachycardia -Continue metoprolol 25 mg twice daily  Essential hypertension -Permissive  hypertension  Leukocytosis, chronic -Secondary to underlying malignancy and prednisone use.  Diabetes mellitus type 2 -Likely induced from steroids.  Insulin sliding/Accu-Chek. -Add Lantus 5 units daily  Hyperlipidemia -On Zetia  High-grade invasive bladder cancer Metastatic adenocarcinoma of the lung -Follows at [facility].  Given acute CVA, pulmonary embolism and advanced malignancy-palliative care team to see the patient today.  Speech recommending nectar thick liquid, medication administration crushed with pure.  Patient is now agreeable for SNF, working with [facility] team   DVT prophylaxis:  Heparin drip to be transition to Xarelto  Code Status: Full code Family Communication:   Dispo: The patient is from: Home              Anticipated d/c is to: SNF              Anticipated d/c date is: 1-2 days              Patient currently Is medically stable to be discharged to SNF when bed available  Body mass index is 16.16 kg/m.      Subjective: No acute events overnight, no complaints This morning patient is doing well mentally and is agreeable to go to rehab.  Examination:  Constitutional: Not in acute distress, overall quite weak appearing Respiratory: Clear to auscultation bilaterally Cardiovascular: Normal sinus rhythm, no rubs Abdomen: Nontender nondistended good bowel sounds Musculoskeletal: No edema noted Skin: No rashes seen Neurologic: CN 2-12 grossly intact.  And nonfocal Psychiatric: Normal judgment and insight. Alert and oriented x 3. Normal mood. Objective: Vitals:   08/03/20 1618 08/03/20 2000 08/03/20 2335 08/04/20 0323  BP: 131/70 (!) 121/55 (!) 103/51 134/61  Pulse: 85 84 (!) 58 (!) 59  Resp: 16 18 16 16   Temp: 98.4 F (36.9 C)  98.2 F (36.8 C) 98.3 F (36.8 C) 99.7 F (37.6 C)  TempSrc: Oral Oral Oral Oral  SpO2: 96%  99% 100%  Weight:      Height:        Intake/Output Summary (Last 24 hours) at 08/04/2020 0815 Last data filed  at 08/04/2020 9528 Gross per 24 hour  Intake 150 ml  Output 1900 ml  Net -1750 ml   Filed Weights   07/31/20 1355 07/31/20 2059  Weight: 50.3 kg 46.8 kg     Data Reviewed:   CBC: Recent Labs  Lab 07/31/20 1323 07/31/20 1327 08/02/20 0244 08/03/20 0051 08/04/20 0221  WBC 23.6*  --  18.6* 18.3* 14.1*  NEUTROABS 18.5*  --   --   --   --   HGB 11.3* 11.6* 10.6* 9.3* 8.7*  HCT 34.3* 34.0* 32.2* 28.4* 27.1*  MCV 87.3  --  85.6 86.3 89.1  PLT 294  --  228 214 413   Basic Metabolic Panel: Recent Labs  Lab 07/31/20 1323 07/31/20 1327 08/04/20 0221  NA 137 136 140  K 4.4 4.6 3.2*  CL 102 103 109  CO2 19*  --  26  GLUCOSE 235* 235* 129*  BUN 31* 39* 13  CREATININE 1.38* 1.20 0.87  CALCIUM 9.5  --  8.6*  MG  --   --  1.8   GFR: Estimated Creatinine Clearance: 51.6 mL/min (by C-G formula based on SCr of 0.87 mg/dL). Liver Function Tests: Recent Labs  Lab 07/31/20 1323  AST 36  ALT 20  ALKPHOS 176*  BILITOT 1.5*  PROT 6.0*  ALBUMIN 3.2*   No results for input(s): LIPASE, AMYLASE in the last 168 hours. No results for input(s): AMMONIA in the last 168 hours. Coagulation Profile: Recent Labs  Lab 07/31/20 1323  INR 1.5*   Cardiac Enzymes: No results for input(s): CKTOTAL, CKMB, CKMBINDEX, TROPONINI in the last 168 hours. BNP (last 3 results) No results for input(s): PROBNP in the last 8760 hours. HbA1C: No results for input(s): HGBA1C in the last 72 hours. CBG: Recent Labs  Lab 08/03/20 2015 08/03/20 2024 08/04/20 0018 08/04/20 0441 08/04/20 0756  GLUCAP 89 156* 172* 77 94   Lipid Profile: No results for input(s): CHOL, HDL, LDLCALC, TRIG, CHOLHDL, LDLDIRECT in the last 72 hours. Thyroid Function Tests: No results for input(s): TSH, T4TOTAL, FREET4, T3FREE, THYROIDAB in the last 72 hours. Anemia Panel: No results for input(s): VITAMINB12, FOLATE, FERRITIN, TIBC, IRON, RETICCTPCT in the last 72 hours. Sepsis Labs: No results for input(s):  PROCALCITON, LATICACIDVEN in the last 168 hours.  Recent Results (from the past 240 hour(s))  Respiratory Panel by RT PCR (Flu A&B, Covid) - Nasopharyngeal Swab     Status: None   Collection Time: 07/31/20  4:39 PM   Specimen: Nasopharyngeal Swab  Result Value Ref Range Status   SARS Coronavirus 2 by RT PCR NEGATIVE NEGATIVE Final    Comment: (NOTE) SARS-CoV-2 target nucleic acids are NOT DETECTED.  The SARS-CoV-2 RNA is generally detectable in upper respiratoy specimens during the acute phase of infection. The lowest concentration of SARS-CoV-2 viral copies this assay can detect is 131 copies/mL. A negative result does not preclude SARS-Cov-2 infection and should not be used as the sole basis for treatment or other patient management decisions. A negative result may occur with  improper specimen collection/handling, submission of specimen other than nasopharyngeal swab, presence of viral mutation(s) within the areas targeted by this assay, and inadequate number of viral copies (<131  copies/mL). A negative result must be combined with clinical observations, patient history, and epidemiological information. The expected result is Negative.  Fact Sheet for Patients:  PinkCheek.be  Fact Sheet for Healthcare Providers:  GravelBags.it  This test is no t yet approved or cleared by the [place] FDA and  has been authorized for detection and/or diagnosis of SARS-CoV-2 by FDA under an Emergency Use Authorization (EUA). This EUA will remain  in effect (meaning this test can be used) for the duration of the COVID-19 declaration under Section 564(b)(1) of the Act, 21 U.S.C. section 360bbb-3(b)(1), unless the authorization is terminated or revoked sooner.     Influenza A by PCR NEGATIVE NEGATIVE Final   Influenza B by PCR NEGATIVE NEGATIVE Final    Comment: (NOTE) The Xpert Xpress SARS-CoV-2/FLU/RSV assay is intended as an aid  in  the diagnosis of influenza from Nasopharyngeal swab specimens and  should not be used as a sole basis for treatment. Nasal washings and  aspirates are unacceptable for Xpert Xpress SARS-CoV-2/FLU/RSV  testing.  Fact Sheet for Patients: PinkCheek.be  Fact Sheet for Healthcare Providers: GravelBags.it  This test is not yet approved or cleared by the [place] FDA and  has been authorized for detection and/or diagnosis of SARS-CoV-2 by  FDA under an Emergency Use Authorization (EUA). This EUA will remain  in effect (meaning this test can be used) for the duration of the  Covid-19 declaration under Section 564(b)(1) of the Act, 21  U.S.C. section 360bbb-3(b)(1), unless the authorization is  terminated or revoked. Performed at [facility], [place] [address], [place], [place] [address]          Radiology Studies: CT HEAD W & WO CONTRAST  Result Date: 08/02/2020 CLINICAL DATA:  Stroke follow-up EXAM: CT HEAD WITHOUT AND WITH CONTRAST TECHNIQUE: Contiguous axial images were obtained from the base of the skull through the vertex without and with intravenous contrast CONTRAST:  82mL OMNIPAQUE IOHEXOL 300 MG/ML  SOLN COMPARISON:  CT 07/31/2020, degraded MRI 07/31/2020 FINDINGS: Brain: Small areas of hypoattenuation in the bilateral frontal lobes seen on the prior study as well as several new/better visualized small areas in the frontoparietal lobes, occipital lobes, and cerebellum. These correspond to areas of infarction on the interval MRI. There is no acute intracranial hemorrhage. No mass effect. No abnormal enhancement. Ventricles are stable in size. Vascular: No new finding. Skull: Calvarium is unremarkable. Sinuses/Orbits: No acute finding. Other: None. IMPRESSION: Multiple small acute infarcts as seen on recent MRI. There is no evidence of metastatic disease. Electronically Signed   By: Macy Mis M.D.   On:  08/02/2020 12:49        Scheduled Meds: . aspirin  300 mg Rectal Daily   Or  . aspirin  325 mg Oral Daily  . ezetimibe  10 mg Oral Daily  . fluticasone  2 spray Each Nare Daily  . insulin aspart  0-9 Units Subcutaneous Q4H  . insulin glargine  5 Units Subcutaneous Daily  . metoprolol tartrate  12.5 mg Oral BID   Continuous Infusions: . sodium chloride    . sodium chloride 50 mL/hr at 08/03/20 1249  . heparin 850 Units/hr (08/03/20 1956)     LOS: 3 days   Time spent= 35 mins    Kristiana Jacko Arsenio Loader, MD [facility]  If 7PM-7AM, please contact night-coverage  08/04/2020, 8:15 AM

## 2020-08-05 LAB — BASIC METABOLIC PANEL
Anion gap: 7 (ref 5–15)
BUN: 11 mg/dL (ref 8–23)
CO2: 27 mmol/L (ref 22–32)
Calcium: 8.9 mg/dL (ref 8.9–10.3)
Chloride: 107 mmol/L (ref 98–111)
Creatinine, Ser: 0.88 mg/dL (ref 0.61–1.24)
GFR, Estimated: >60 mL/min (ref >60)
Glucose, Bld: 166 mg/dL — ABNORMAL HIGH (ref 70–99)
Potassium: 4.1 mmol/L (ref 3.5–5.1)
Sodium: 141 mmol/L (ref 135–145)

## 2020-08-05 LAB — GLUCOSE, CAPILLARY
Glucose-Capillary: 146 mg/dL — ABNORMAL HIGH (ref 70–99)
Glucose-Capillary: 192 mg/dL — ABNORMAL HIGH (ref 70–99)
Glucose-Capillary: 286 mg/dL — ABNORMAL HIGH (ref 70–99)
Glucose-Capillary: 97 mg/dL (ref 70–99)
Glucose-Capillary: 226 mg/dL — ABNORMAL HIGH (ref 70–99)
Glucose-Capillary: 125 mg/dL — ABNORMAL HIGH (ref 70–99)

## 2020-08-05 LAB — CBC
HCT: 31.4 % — ABNORMAL LOW (ref 39.0–52.0)
Hemoglobin: 10.2 g/dL — ABNORMAL LOW (ref 13.0–17.0)
MCH: 28.8 pg (ref 26.0–34.0)
MCHC: 32.5 g/dL (ref 30.0–36.0)
MCV: 88.7 fL (ref 80.0–100.0)
Platelets: 267 10*3/uL (ref 150–400)
RBC: 3.54 MIL/uL — ABNORMAL LOW (ref 4.22–5.81)
RDW: 16.6 % — ABNORMAL HIGH (ref 11.5–15.5)
WBC: 16.4 10*3/uL — ABNORMAL HIGH (ref 4.0–10.5)
nRBC: 0 % (ref 0.0–0.2)

## 2020-08-05 LAB — MAGNESIUM: Magnesium: 2 mg/dL (ref 1.7–2.4)

## 2020-08-05 MED ORDER — RIVAROXABAN 20 MG PO TABS: 20.0000 mg | ORAL_TABLET | Freq: Every day | ORAL | Status: AC

## 2020-08-05 MED ORDER — RIVAROXABAN 15 MG PO TABS: 15.0000 mg | ORAL_TABLET | Freq: Two times a day (BID) | ORAL | Status: AC

## 2020-08-05 MED ORDER — INSULIN ASPART 100 UNIT/ML ~~LOC~~ SOLN: SUBCUTANEOUS | 11 refills | Status: AC

## 2020-08-05 MED ORDER — METOPROLOL TARTRATE 25 MG PO TABS: 12.5000 mg | ORAL_TABLET | Freq: Two times a day (BID) | ORAL | Status: AC

## 2020-08-05 NOTE — Progress Notes (Signed)
Physical Therapy Treatment Patient Details Name: Jonathan Miranda MRN: [id number] DOB: [birth year] Today's Date: 08/05/2020    History of Present Illness 69 y.o. male with medical history significant of hypertension, hyperlipidemia, metastatic adenocarcinoma of the lung and bladder cancer seen in the emergency room for encephalopathy. Pt reports he had a fall and woke p with EMS present. LKN was 10/26 when seen by a friend who takes care of him. Pt found to have multiple bilateral small infarcts on MRI.    PT Comments    Pt is making significant progress towards his goals as he was able to ambulate ~20 ft this date with a RW. However, he appears to display R side neglect as he requires repeated cues and hand-over-hand directions to utilize his R hand for transfers and for placement on the RW. He also displays impaired cognition, impacting his safety. In support, he would ambulate his RW straight into a wall and then was unable to correct himself without continuous cues to get unstuck and return to his chair. He continues to display balance and LE strength deficits through his B knees shaking during standing or gait bouts. Will continue to follow acutely and recommend SNF upon d/c to address his mentioned deficits in order to maximize his independence and safety with all functional mobility.   Follow Up Recommendations  SNF;Supervision/Assistance - 24 hour     Equipment Recommendations  Wheelchair (measurements PT);Wheelchair cushion (measurements PT);Hospital bed;3in1 (PT);Rolling walker with 5" wheels    Recommendations for Other Services       Precautions / Restrictions Precautions Precautions: Fall Restrictions Weight Bearing Restrictions: No    Mobility  Bed Mobility Overal bed mobility: Needs Assistance Bed Mobility: Rolling;Sidelying to Sit Rolling: Min guard Sidelying to sit: Min assist       General bed mobility comments: Required extra time and repeated VCs and TCs to  utilize bed rail to roll onto side and then bring knees towards chest and LEs off EOB while ascending trunk with R UE. Cued pt to bring R elbow more posteriorly until able to transition onto forearm> hand to ascend trunk, with success.  Transfers Overall transfer level: Needs assistance Equipment used: Rolling walker (2 wheeled) Transfers: Sit to/from [facility] Sit to Stand: Min guard Stand pivot transfers: Min assist       General transfer comment: To come to stand, pt required min guard and hand-over-hand repeated cues to push off current surface rather than pull up on RW. Repeated cues provided to reach for target sitting surface when returning to sit, without success. Min A to manage RW and provide single step commands to sequence stand step transfer to R from EOB to bedside chair, no LOB noted but unsteadiness in knees noted.  Ambulation/Gait Ambulation/Gait assistance: Min assist Gait Distance (Feet): 20 Feet Assistive device: Rolling walker (2 wheeled) Gait Pattern/deviations: Decreased stride length;Trunk flexed;Wide base of support Gait velocity: decreased Gait velocity interpretation: <1.31 ft/sec, indicative of household ambulator General Gait Details: Pt ambulates at slow pace with B knee instability noted. Required step-by-step single step commands to sequence steps and to manage RW. He tends to foget to place his R hand on the RW and bump into objects or go straight into wall with RW without correcting. He requires cues and assistance to get unstuck.   Stairs             Wheelchair Mobility    Modified Rankin (Stroke Patients Only) Modified Rankin (Stroke Patients Only) Pre-Morbid Rankin Score:  No symptoms Modified Rankin: Moderately severe disability     Balance Overall balance assessment: Needs assistance Sitting-balance support: Feet supported;Single extremity supported Sitting balance-Leahy Scale: Poor Sitting balance - Comments: Pt sits  statically EOB without LOB, but trunk flexed. Postural control: Right lateral lean Standing balance support: Bilateral upper extremity supported;During functional activity Standing balance-Leahy Scale: Poor Standing balance comment: Requires min guard for static standing and functional mobility with B UE support. Displayed B knee instability and trunk flexion but no LOB this date.                            Cognition Arousal/Alertness: Awake/alert Behavior During Therapy: WFL for tasks assessed/performed Overall Cognitive Status: Impaired/Different from baseline Area of Impairment: Attention;Memory;Following commands;Safety/judgement;Problem solving;Awareness                   Current Attention Level: Sustained Memory: Decreased short-term memory;Decreased recall of precautions (required repeated cues throughout) Following Commands: Follows one step commands with increased time;Follows one step commands inconsistently Safety/Judgement: Decreased awareness of safety;Decreased awareness of deficits Awareness: Intellectual Problem Solving: Slow processing;Difficulty sequencing;Requires verbal cues;Requires tactile cues General Comments: Required repeated cues throughout session, with hand-over-hand directions at times, to perform all functional mobility. Required extra time to respond to cues. Pt would ambulate and bump objects or go straight to wall with RW and require cues to correct and to problem-solve how to get unstuck from wall. At times, he appeared to neglect his R UE.      Exercises      General Comments        Pertinent Vitals/Pain Pain Assessment: No/denies pain Faces Pain Scale: No hurt    Home Living                      Prior Function            PT Goals (current goals can now be found in the care plan section) Acute Rehab PT Goals Patient Stated Goal: to get better and go home PT Goal Formulation: With patient Time For Goal  Achievement: 08/15/20 Potential to Achieve Goals: Good Progress towards PT goals: Progressing toward goals    Frequency    Min 3X/week      PT Plan Current plan remains appropriate    Co-evaluation              AM-PAC PT "6 Clicks" Mobility   Outcome Measure  Help needed turning from your back to your side while in a flat bed without using bedrails?: A Little Help needed moving from lying on your back to sitting on the side of a flat bed without using bedrails?: A Little Help needed moving to and from a bed to a chair (including a wheelchair)?: A Little Help needed standing up from a chair using your arms (e.g., wheelchair or bedside chair)?: A Little Help needed to walk in hospital room?: A Little Help needed climbing 3-5 steps with a railing? : Total 6 Click Score: 16    End of Session Equipment Utilized During Treatment: Gait belt Activity Tolerance: Patient tolerated treatment well Patient left: in chair;with call bell/phone within reach;with chair alarm set   PT Visit Diagnosis: Other abnormalities of gait and mobility (R26.89);Muscle weakness (generalized) (M62.81);Hemiplegia and hemiparesis;Unsteadiness on feet (R26.81);Difficulty in walking, not elsewhere classified (R26.2);Other symptoms and signs involving the nervous system (R29.898) Hemiplegia - Right/Left: Right Hemiplegia - dominant/non-dominant: Dominant Hemiplegia - caused by: Cerebral  infarction     Time: 0375-4360 PT Time Calculation (min) (ACUTE ONLY): 38 min  Charges:  $Gait Training: 8-22 mins $Therapeutic Activity: 8-22 mins $Neuromuscular Re-education: 8-22 mins                     Moishe Spice, PT, DPT Acute Rehabilitation Services  Pager: [phone] Office: [place] 08/05/2020, 1:10 PM

## 2020-08-05 NOTE — Discharge Summary (Addendum)
Physician Discharge Summary  MOHAMMED MCANDREW ZOX:[id number] DOB: May 18, 1951 DOA: 07/31/2020  PCP: Biagio Borg, MD  Admit date: 07/31/2020 Discharge date: 08/06/2020  Admitted From: Home Disposition:  SNF  Recommendations for Outpatient Follow-up:  1. Follow up with PCP in 1-2 weeks 2. Please obtain BMP/CBC in one week your next doctors visit.  3. Xarelto 15mg  po bid until 08/08/20; 20mg  po daily thereafter  4. Intolerant to Statin therefore on Zetia.  5. Speech recommending nectar thick liquid, medication administration crushed with pure.  Discharge Condition: Stable CODE STATUS: DNR Diet recommendation: Heart healthy; Speech recommending nectar thick liquid, medication administration crushed with pure.  Brief/Interim Summary: 69 y.o.malewith medical history significant ofhypertension, hyperlipidemia, metastatic adenocarcinoma of the lung and bladder cancer seen in the emergency room for encephalopathy.  The symptoms were very nonspecific.  EEG was unremarkable, CT head was negative but MRI showed multifocal CVA.  Neurology team was consulted.  Palliative care team was consulted fo goals of care discussion.  CT head with and without contrast multifocal CVA but no evidence of metastatic disease.  PT recommended SNF. Rest of the recommendation as above.    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 fib or hypercoagulable state from underlying malignancy. -CT head negative but MRI confirms CVA -CT head with contrast -again confirmed  multifocal CVA but no evidence of metastatic disease. -Echocardiogram -EF 65 to 70%, grade 1 DD, moderate pericardial effusion -LDL-151, A1c-8.1 -Neurology consulted.  Permissive hypertension -Xarleto on d/c - 15mg  po bid until 11/4; thereafter 08/29/2020  Nonobstructive bilateral pulmonary embolism, diagnosed 07/19/2020 Mediastinal lymphadenopathy -Xarelto  Moderate pericardial effusion -No tamponade physiology but I suspect this is malignant in nature  Sinus tachycardia, improved -EKG-negative for A. fib which showed sinus tachycardia -Continue metoprolol 25 mg twice daily  Essential hypertension -Permissive hypertension  Leukocytosis, chronic -Secondary to underlying malignancy and prednisone use.  Diabetes mellitus type 2 -Likely induced from steroids.  Insulin sliding/Accu-Chek. -adjust going forwards as needed as his appetite improves.   Hyperlipidemia -On Zetia  High-grade invasive bladder cancer Metastatic adenocarcinoma of the lung -Follows at [facility].  Given acute CVA, pulmonary embolism and advanced malignancy-palliative care team to see the patient today.   Body mass index is 16.16 kg/m.     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  Discharge Exam: Vitals:   08/06/20 0318 08/06/20 0752  BP: 140/77 (!) 160/85  Pulse: 77 (!) 109  Resp: 18 18  Temp: 98.3 F (36.8 C) 98.3 F (36.8 C)  SpO2: 99% 100%   Vitals:   08/05/20 2009 08/05/20 2328 08/06/20 0318 08/06/20 8119  BP: (!)  144/81 (!) 141/86 140/77 (!) 160/85  Pulse: 75 79 77 (!) 109  Resp: 18 18 18 18   Temp: 98.2 F (36.8 C) 98.5 F (36.9 C) 98.3 F (36.8 C) 98.3 F (36.8 C)  TempSrc: Oral Oral Oral Oral  SpO2: 99% 98% 99% 100%  Weight:      Height:        General: Pt is alert, awake, not in acute distress Cardiovascular: RRR, S1/S2 +, no rubs, no gallops Respiratory: CTA bilaterally, no wheezing, no rhonchi Abdominal: Soft, NT, ND, bowel sounds + Extremities: no edema, no cyanosis  Discharge Instructions   Allergies as of 08/06/2020      Reactions   Zolpidem Anaphylaxis   Mother and sister had hallucinations to Ambien. Patient will decline taking this due family history.   Statins Hives   Iodine Hives   Penicillins Hives   Shellfish Allergy Hives      Medication List    STOP taking these medications   aspirin EC 81 MG tablet   doxycycline 100 MG tablet Commonly known as: VIBRA-TABS   HYDROcodone-acetaminophen 5-325 MG tablet Commonly known as: NORCO/VICODIN   metFORMIN 500 MG 24 hr tablet Commonly known as: GLUCOPHAGE-XR   PRESCRIPTION MEDICATION   Rivaroxaban Stater Pack (15 mg and 20 mg) Commonly known as: XARELTO STARTER PACK Replaced by: Rivaroxaban 15 MG Tabs tablet     TAKE these medications   cimetidine 400 MG tablet Commonly known as: TAGAMET Take 400 mg by mouth 3 (three) times daily.   ezetimibe 10 MG tablet Commonly known as: Zetia Take 1 tablet (10 mg total) by mouth daily.   hydrOXYzine 10 MG tablet Commonly known as: ATARAX/VISTARIL Take 10 mg by mouth in the morning and at bedtime.   insulin aspart 100 UNIT/ML injection Commonly known as: novoLOG Blood Sugar  Low Dose   60-110  No Insulin 111-150  2 units 151-200  4 units  20 1-250  6 units 251-300  8 units  301-350  10 units  >350              12 units Call MD  For blood sugar < 60 mg/dL . If patient can eat or drink, give 15 g of carbohydrate (4 oz fruit juice, soda, or 3-4 glucose  tablets) . If patient NPO, give D50W 25 mL as IV push . Check blood sugar every 15 minutes and repeat above if < 80 mg/dL   lisinopril 40 MG tablet Commonly known as: ZESTRIL Take 40 mg by mouth daily. What changed: Another medication with the same name was removed. Continue taking this medication, and follow the directions you see here.   metoprolol tartrate 25 MG tablet Commonly known as: LOPRESSOR Take 0.5 tablets (12.5 mg total) by mouth 2 (two) times daily.   Rivaroxaban 15 MG Tabs tablet Commonly known as: XARELTO Take 1 tablet (15 mg total) by mouth 2 (two) times daily with a meal for 3 days. Replaces: Rivaroxaban Stater Pack (15 mg and 20 mg)   rivaroxaban 20 MG Tabs tablet Commonly known as: XARELTO Take 1 tablet (20 mg total) by mouth daily with supper. Start taking on: August 09, 2020       Follow-up Information    Biagio Borg, MD. Schedule an appointment as soon as possible for a visit in 1 week(s).   Specialties: Internal Medicine, Radiology Contact information: [place] [address] [phone]        Garvin Fila,  MD. Schedule an appointment as soon as possible for a visit in 3 week(s).   Specialties: Neurology, Radiology Contact information: [address] [place] [address] [phone]              Allergies  Allergen Reactions  . Zolpidem Anaphylaxis    Mother and sister had hallucinations to Ambien. Patient will decline taking this due family history.   . Statins Hives  . Iodine Hives  . Penicillins Hives  . Shellfish Allergy Hives    You were cared for by a hospitalist during your hospital stay. If you have any questions about your discharge medications or the care you received while you were in the hospital after you are discharged, you can call the unit and asked to speak with the hospitalist on call if the hospitalist that took care of you is not available. Once you are discharged, your primary  care physician will handle any further medical issues. Please note that no refills for any discharge medications will be authorized once you are discharged, as it is imperative that you return to your primary care physician (or establish a relationship with a primary care physician if you do not have one) for your aftercare needs so that they can reassess your need for medications and monitor your lab values.   Procedures/Studies: CT Code Stroke CTA Head W/WO contrast  Result Date: 07/31/2020 CLINICAL DATA:  Right-sided weakness EXAM: CT ANGIOGRAPHY HEAD AND NECK CT PERFUSION BRAIN TECHNIQUE: Multidetector CT imaging of the head and neck was performed using the standard protocol during bolus administration of intravenous contrast. Multiplanar CT image reconstructions and MIPs were obtained to evaluate the vascular anatomy. Carotid stenosis measurements (when applicable) are obtained utilizing NASCET criteria, using the distal internal carotid diameter as the denominator. Multiphase CT imaging of the brain was performed following IV bolus contrast injection. Subsequent parametric perfusion maps were calculated using RAPID software. CONTRAST:  136mL OMNIPAQUE IOHEXOL 350 MG/ML SOLN COMPARISON:  None. FINDINGS: CTA NECK Aortic arch: Mild plaque along the aortic arch and great vessel origins, which are patent. Right carotid system: Patent. Primarily calcified plaque at the ICA origin causing less than 50% stenosis. Left carotid system: Patent. Primarily calcified plaque at the ICA origin causing less than 50% stenosis. Vertebral arteries: Patent.  Left vertebral artery is dominant. Skeleton: Advanced multilevel degenerative changes of the cervical spine. Degenerative changes of the temporomandibular joints. Other neck: No neck mass. Right supraclavicular adenopathy identified on prior chest CT appears to be present but poorly evaluated due to artifact. Upper chest: Included upper lungs are clear. Review of the  MIP images confirms the above findings CTA HEAD Anterior circulation: Intracranial internal carotid arteries patent with mild calcified plaque. Anterior and middle cerebral arteries are patent. Posterior circulation: Intracranial vertebral arteries are patent. Minimal calcified plaque on the left. Basilar artery is patent. Posterior cerebral arteries are patent. Venous sinuses: Patent as allowed by contrast bolus timing. No apparent enhancement in the region of bilateral frontal hypodense lesions. Review of the MIP images confirms the above findings CT Brain Perfusion Findings: CBF (<30%) Volume: 53mL Perfusion (Tmax>6.0s) volume: 2mL Mismatch Volume: 93mL Infarction Location: None IMPRESSION: No large vessel occlusion or hemodynamically significant stenosis. Perfusion imaging demonstrates no evidence of core infarction or penumbra. No apparent enhancement or definite hyperperfusion in the region of low-density lesions on head CT. Electronically Signed   By: Macy Mis M.D.   On: 07/31/2020 14:11   CT HEAD W & WO CONTRAST  Result Date: 08/02/2020 CLINICAL DATA:  Stroke follow-up EXAM: CT HEAD WITHOUT AND WITH CONTRAST TECHNIQUE: Contiguous axial images were obtained from the base of the skull through the vertex without and with intravenous contrast CONTRAST:  6mL OMNIPAQUE IOHEXOL 300 MG/ML  SOLN COMPARISON:  CT 07/31/2020, degraded MRI 07/31/2020 FINDINGS: Brain: Small areas of hypoattenuation in the bilateral frontal lobes seen on the prior study as well as several new/better visualized small areas in the frontoparietal lobes, occipital lobes, and cerebellum. These correspond to areas of infarction on the interval MRI. There is no acute intracranial hemorrhage. No mass effect. No abnormal enhancement. Ventricles are stable in size. Vascular: No new finding. Skull: Calvarium is unremarkable. Sinuses/Orbits: No acute finding. Other: None. IMPRESSION: Multiple small acute infarcts as seen on recent MRI. There  is no evidence of metastatic disease. Electronically Signed   By: Macy Mis M.D.   On: 08/02/2020 12:49   CT Code Stroke CTA Neck W/WO contrast  Result Date: 07/31/2020 CLINICAL DATA:  Right-sided weakness EXAM: CT ANGIOGRAPHY HEAD AND NECK CT PERFUSION BRAIN TECHNIQUE: Multidetector CT imaging of the head and neck was performed using the standard protocol during bolus administration of intravenous contrast. Multiplanar CT image reconstructions and MIPs were obtained to evaluate the vascular anatomy. Carotid stenosis measurements (when applicable) are obtained utilizing NASCET criteria, using the distal internal carotid diameter as the denominator. Multiphase CT imaging of the brain was performed following IV bolus contrast injection. Subsequent parametric perfusion maps were calculated using RAPID software. CONTRAST:  160mL OMNIPAQUE IOHEXOL 350 MG/ML SOLN COMPARISON:  None. FINDINGS: CTA NECK Aortic arch: Mild plaque along the aortic arch and great vessel origins, which are patent. Right carotid system: Patent. Primarily calcified plaque at the ICA origin causing less than 50% stenosis. Left carotid system: Patent. Primarily calcified plaque at the ICA origin causing less than 50% stenosis. Vertebral arteries: Patent.  Left vertebral artery is dominant. Skeleton: Advanced multilevel degenerative changes of the cervical spine. Degenerative changes of the temporomandibular joints. Other neck: No neck mass. Right supraclavicular adenopathy identified on prior chest CT appears to be present but poorly evaluated due to artifact. Upper chest: Included upper lungs are clear. Review of the MIP images confirms the above findings CTA HEAD Anterior circulation: Intracranial internal carotid arteries patent with mild calcified plaque. Anterior and middle cerebral arteries are patent. Posterior circulation: Intracranial vertebral arteries are patent. Minimal calcified plaque on the left. Basilar artery is patent.  Posterior cerebral arteries are patent. Venous sinuses: Patent as allowed by contrast bolus timing. No apparent enhancement in the region of bilateral frontal hypodense lesions. Review of the MIP images confirms the above findings CT Brain Perfusion Findings: CBF (<30%) Volume: 52mL Perfusion (Tmax>6.0s) volume: 39mL Mismatch Volume: 2mL Infarction Location: None IMPRESSION: No large vessel occlusion or hemodynamically significant stenosis. Perfusion imaging demonstrates no evidence of core infarction or penumbra. No apparent enhancement or definite hyperperfusion in the region of low-density lesions on head CT. Electronically Signed   By: Macy Mis M.D.   On: 07/31/2020 14:11   MR BRAIN WO CONTRAST  Result Date: 07/31/2020 CLINICAL DATA:  Code stroke follow-up EXAM: MRI HEAD WITHOUT CONTRAST TECHNIQUE: Multiplanar, multiecho pulse sequences of the brain and surrounding structures were obtained without intravenous contrast. COMPARISON:  None. FINDINGS: Diffusion-weighted imaging was obtained. Patient could not tolerate remainder of study. Significant motion artifact is present. There are numerous small foci of reduced diffusion involving bilateral frontoparietal and occipital lobes as well as the cerebellum. IMPRESSION: Single sequence study  with significant motion degradation. Multiple small acute infarcts involving bilateral vascular territories. Electronically Signed   By: Macy Mis M.D.   On: 07/31/2020 19:13   CT C-SPINE NO CHARGE  Result Date: 07/31/2020 CLINICAL DATA:  69 year old male with fall and neck trauma. EXAM: CT CERVICAL SPINE WITHOUT CONTRAST TECHNIQUE: Multidetector CT imaging of the cervical spine was performed without intravenous contrast. Multiplanar CT image reconstructions were also generated. COMPARISON:  CT angiography dated 07/31/2020. FINDINGS: Alignment: Acute subluxation. Grade 1 C4-C5 anterolisthesis. Skull base and vertebrae: No acute fracture. Soft tissues and spinal  canal: No prevertebral fluid or swelling. No visible canal hematoma. Disc levels: Multilevel degenerative changes with endplate irregularity and disc space narrowing and spurring. Multilevel facet arthropathy. Upper chest: Negative. Other: A subcentimeter right thyroid hypodense nodule. Not clinically significant; no follow-up imaging recommended (ref: J Am Coll Radiol. 2015 Feb;12(2): 143-50). Bilateral carotid bulb calcified plaques with IMPRESSION: 1. No acute/traumatic cervical spine pathology. 2. Multilevel degenerative changes. Electronically Signed   By: Anner Crete M.D.   On: 07/31/2020 16:13   CT Code Stroke Cerebral Perfusion with contrast  Result Date: 07/31/2020 CLINICAL DATA:  Right-sided weakness EXAM: CT ANGIOGRAPHY HEAD AND NECK CT PERFUSION BRAIN TECHNIQUE: Multidetector CT imaging of the head and neck was performed using the standard protocol during bolus administration of intravenous contrast. Multiplanar CT image reconstructions and MIPs were obtained to evaluate the vascular anatomy. Carotid stenosis measurements (when applicable) are obtained utilizing NASCET criteria, using the distal internal carotid diameter as the denominator. Multiphase CT imaging of the brain was performed following IV bolus contrast injection. Subsequent parametric perfusion maps were calculated using RAPID software. CONTRAST:  136mL OMNIPAQUE IOHEXOL 350 MG/ML SOLN COMPARISON:  None. FINDINGS: CTA NECK Aortic arch: Mild plaque along the aortic arch and great vessel origins, which are patent. Right carotid system: Patent. Primarily calcified plaque at the ICA origin causing less than 50% stenosis. Left carotid system: Patent. Primarily calcified plaque at the ICA origin causing less than 50% stenosis. Vertebral arteries: Patent.  Left vertebral artery is dominant. Skeleton: Advanced multilevel degenerative changes of the cervical spine. Degenerative changes of the temporomandibular joints. Other neck: No neck  mass. Right supraclavicular adenopathy identified on prior chest CT appears to be present but poorly evaluated due to artifact. Upper chest: Included upper lungs are clear. Review of the MIP images confirms the above findings CTA HEAD Anterior circulation: Intracranial internal carotid arteries patent with mild calcified plaque. Anterior and middle cerebral arteries are patent. Posterior circulation: Intracranial vertebral arteries are patent. Minimal calcified plaque on the left. Basilar artery is patent. Posterior cerebral arteries are patent. Venous sinuses: Patent as allowed by contrast bolus timing. No apparent enhancement in the region of bilateral frontal hypodense lesions. Review of the MIP images confirms the above findings CT Brain Perfusion Findings: CBF (<30%) Volume: 49mL Perfusion (Tmax>6.0s) volume: 32mL Mismatch Volume: 10mL Infarction Location: None IMPRESSION: No large vessel occlusion or hemodynamically significant stenosis. Perfusion imaging demonstrates no evidence of core infarction or penumbra. No apparent enhancement or definite hyperperfusion in the region of low-density lesions on head CT. Electronically Signed   By: Macy Mis M.D.   On: 07/31/2020 14:11   DG Swallowing Func-Speech Pathology  Result Date: 08/01/2020 Objective Swallowing Evaluation: Type of Study: MBS-Modified Barium Swallow Study  Patient Details Name: ANQUAN AZZARELLO MRN: [id number] Date of Birth: 19-Nov-1950 Today's Date: 08/01/2020 Time: SLP Start Time (ACUTE ONLY): 1332 -SLP Stop Time (ACUTE ONLY): 3810 SLP Time Calculation (min) (ACUTE ONLY):  17 min Past Medical History: Past Medical History: Diagnosis Date . Allergic rhinitis  . Aortic stenosis, mild 11/28/2015 . Back pain  . Cancer ([place])  . Hep A w/o coma  . Hyperlipidemia  . Hypertension  . Nephrolithiasis  . Personal history of colonic polyps  . Prostatitis  . Rash  . Tinnitus  Past Surgical History: Past Surgical History: Procedure Laterality Date .  correction deviated septum   . ORIF wrist- '05   . ureteroscopyp   HPI: DARALD UZZLE is a 69 y.o. male with medical history significant of hypertension, hyperlipidemia, metastatic adenocarcinoma of the lung and bladder cancer seen in the emergency room for encephalopathy.  Code stroke called on patient arrival. HPi is limited due to pt being confused, he states he fell does not know when and how and when he woke up ems was there.Pt lives alone and per edmd note pt  was found on the floor and was last seen normal by friend who takes care of him.  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."  Subjective: Pt awake, alert, pleasant, participative Assessment / Plan / Recommendation CHL IP CLINICAL IMPRESSIONS 08/01/2020 Clinical Impression Pt presents with oropharyngeal dysphagia characterized by reduced bolus propulsion, reduced lingual retraction, a pharyngeal delay, reduced anterior laryngeal moevement, and reduce pharyngeal constriction. He demonstrated impaired A-P transport, vallecular residue, pyriform sinus residue, posterior pharyngeal wall residue, penetration (PAS 3) and aspiration (PAS 7,8) of thin liquids and with consecutive swallows of nectar thick liquids via straw. Delayed throat clearing was inconsistently noted following aspiration and this was ineffective in expelling the aspirant. Prompted coughing was effective in expelling some of the aspirant from the airway. A chin tuck posture sucessfully eliminated aspiration of thin liqudis via cup, but pt exhibited difficulty consistently demonstrating this strategy. Amount of pharyngeal residue increased with bolus size and with more advanced consistencies. Residue was improved with liquid washes and pt's independent use of secondary swallows. A dysphagia 3 diet with nectar thick liquids is recommended at this time with observance of swallowing precautions. SLP will follow for dysphagia treatment.  SLP  Visit Diagnosis Dysphagia, oropharyngeal phase (R13.12) Attention and concentration deficit following -- Frontal lobe and executive function deficit following -- Impact on safety and function Mild aspiration risk   CHL IP TREATMENT RECOMMENDATION 08/01/2020 Treatment Recommendations Defer until completion of intrumental exam   Prognosis 08/01/2020 Prognosis for Safe Diet Advancement Good Barriers to Reach Goals Cognitive deficits Barriers/Prognosis Comment -- CHL IP DIET RECOMMENDATION 08/01/2020 SLP Diet Recommendations Dysphagia 3 (Mech soft) solids;Nectar thick liquid Liquid Administration via Cup;Straw Medication Administration Crushed with puree Compensations Slow rate;Small sips/bites;Follow solids with liquid Postural Changes Remain semi-upright after after feeds/meals (Comment)   CHL IP OTHER RECOMMENDATIONS 08/01/2020 Recommended Consults -- Oral Care Recommendations Oral care BID Other Recommendations --   CHL IP FOLLOW UP RECOMMENDATIONS 08/01/2020 Follow up Recommendations Skilled Nursing facility   [facility] IP FREQUENCY AND DURATION 08/01/2020 Speech Therapy Frequency (ACUTE ONLY) min 2x/week Treatment Duration 2 weeks      CHL IP ORAL PHASE 08/01/2020 Oral Phase Impaired Oral - Pudding Teaspoon -- Oral - Pudding Cup -- Oral - Honey Teaspoon -- Oral - Honey Cup -- Oral - Nectar Teaspoon -- Oral - Nectar Cup -- Oral - Nectar Straw -- Oral - Thin Teaspoon -- Oral - Thin Cup Reduced posterior propulsion Oral - Thin Straw -- Oral - Puree -- Oral - Mech Soft -- Oral - Regular -- Oral - Multi-Consistency -- Oral -  Pill -- Oral Phase - Comment --  CHL IP PHARYNGEAL PHASE 08/01/2020 Pharyngeal Phase Impaired Pharyngeal- Pudding Teaspoon -- Pharyngeal -- Pharyngeal- Pudding Cup -- Pharyngeal -- Pharyngeal- Honey Teaspoon -- Pharyngeal -- Pharyngeal- Honey Cup -- Pharyngeal -- Pharyngeal- Nectar Teaspoon -- Pharyngeal -- Pharyngeal- Nectar Cup Reduced tongue base retraction;Delayed swallow  initiation-vallecula;Pharyngeal residue - pyriform;Pharyngeal residue - valleculae;Pharyngeal residue - posterior pharnyx;Reduced pharyngeal peristalsis Pharyngeal -- Pharyngeal- Nectar Straw Reduced tongue base retraction;Delayed swallow initiation-vallecula;Pharyngeal residue - pyriform;Pharyngeal residue - valleculae;Pharyngeal residue - posterior pharnyx;Reduced pharyngeal peristalsis;Penetration/Aspiration during swallow Pharyngeal Material enters airway, passes BELOW cords without attempt by patient to eject out (silent aspiration) Pharyngeal- Thin Teaspoon -- Pharyngeal -- Pharyngeal- Thin Cup Reduced tongue base retraction;Delayed swallow initiation-vallecula;Pharyngeal residue - pyriform;Pharyngeal residue - valleculae;Pharyngeal residue - posterior pharnyx;Reduced pharyngeal peristalsis Pharyngeal -- Pharyngeal- Thin Straw Reduced tongue base retraction;Delayed swallow initiation-vallecula;Pharyngeal residue - pyriform;Pharyngeal residue - valleculae;Pharyngeal residue - posterior pharnyx;Reduced pharyngeal peristalsis Pharyngeal -- Pharyngeal- Puree Reduced tongue base retraction;Pharyngeal residue - pyriform;Pharyngeal residue - valleculae;Pharyngeal residue - posterior pharnyx;Reduced pharyngeal peristalsis Pharyngeal -- Pharyngeal- Mechanical Soft Reduced tongue base retraction;Pharyngeal residue - pyriform;Pharyngeal residue - valleculae;Pharyngeal residue - posterior pharnyx;Reduced pharyngeal peristalsis Pharyngeal -- Pharyngeal- Regular Reduced tongue base retraction;Pharyngeal residue - pyriform;Pharyngeal residue - valleculae;Pharyngeal residue - posterior pharnyx;Reduced pharyngeal peristalsis Pharyngeal -- Pharyngeal- Multi-consistency -- Pharyngeal -- Pharyngeal- Pill Reduced tongue base retraction;Delayed swallow initiation-vallecula;Pharyngeal residue - pyriform;Pharyngeal residue - valleculae;Pharyngeal residue - posterior pharnyx;Reduced pharyngeal peristalsis Pharyngeal -- Pharyngeal  Comment --  CHL IP CERVICAL ESOPHAGEAL PHASE 08/01/2020 Cervical Esophageal Phase WFL Pudding Teaspoon -- Pudding Cup -- Honey Teaspoon -- Honey Cup -- Nectar Teaspoon -- Nectar Cup -- Nectar Straw -- Thin Teaspoon -- Thin Cup -- Thin Straw -- Puree -- Mechanical Soft -- Regular -- Multi-consistency -- Pill -- Cervical Esophageal Comment -- Shanika I. Hardin Negus, [place], [place] Office number [phone] Pager [place] 08/01/2020, 2:49 PM              EEG adult  Result Date: 07/31/2020 Lora Havens, MD     07/31/2020  3:29 PM Patient Name: BALDOMERO MIRARCHI MRN: [id number] Epilepsy Attending: Lora Havens Referring Physician/Provider: Dr. Kathrynn Speed Date: 07/31/2020 Duration: 23.56 mins Patient history: 69 year old male presented with sudden onset of right-sided weakness.  EEG done for seizures. Level of alertness: Awake, asleep AEDs during EEG study: None Technical aspects: This EEG study was done with scalp electrodes positioned according to the 10-20 International system of electrode placement. Electrical activity was acquired at a sampling rate of 500Hz  and reviewed with a high frequency filter of 70Hz  and a low frequency filter of 1Hz . EEG data were recorded continuously and digitally stored. Description: During awake state, no clear posterior dominant rhythm was seen.  Sleep was characterized by vertex waves, sleep spindles (12 to 14 Hz), maximal frontocentral region.  EEG showed continuous generalized at times rhythmic 3 to 6 Hz theta-delta slowing.  Hyperventilation and photic stimulation were not performed.   ABNORMALITY -Continuous slow, generalized IMPRESSION: This study is suggestive of mild to moderate diffuse encephalopathy, nonspecific etiology.  No seizures or epileptiform discharges were seen throughout the recording. Lora Havens   ECHOCARDIOGRAM COMPLETE  Result Date: 08/01/2020    ECHOCARDIOGRAM REPORT   Patient Name:    THIERNO HUN Date of Exam: 08/01/2020 Medical Rec #:  [id number]           Height:       67.0 in Accession #:    [id number]  Weight:       103.2 lb Date of Birth:  November 04, 1950           BSA:          1.526 m Patient Age:    69 years            BP:           110/78 mmHg Patient Gender: M                   HR:           112 bpm. Exam Location:  Inpatient Procedure: 2D Echo Indications:    stroke 434.91  History:        Patient has prior history of Echocardiogram examinations, most                 recent 11/27/2015. Stroke; Risk Factors:Diabetes, Dyslipidemia                 and Hypertension.  Sonographer:    Johny Chess Referring Phys: [id number] EKTA V PATEL  Sonographer Comments: Image acquisition challenging due to uncooperative patient and Image acquisition challenging due to respiratory motion. IMPRESSIONS  1. Left ventricular ejection fraction, by estimation, is 65 to 70%. The left ventricle has hyperdynamic function. The left ventricle has no regional wall motion abnormalities. Left ventricular diastolic parameters are consistent with Grade I diastolic dysfunction (impaired relaxation).  2. Right ventricular systolic function is normal. The right ventricular size is normal. Tricuspid regurgitation signal is inadequate for assessing PA pressure.  3. Moderate pericardial effusion, primarily along the RV free wall. < 25% respirophasic variation in the mitral valve E inflow doppler velocity, small IVC collapses normally, no RV diastolic collapse. No evidence for tamponade.  4. The mitral valve is normal in structure. No evidence of mitral valve regurgitation. No evidence of mitral stenosis. Moderate mitral annular calcification.  5. The aortic valve is tricuspid. Aortic valve regurgitation is mild. Moderate aortic valve stenosis. Aortic valve area, by VTI measures 1.15 cm. Aortic valve mean gradient measures 22.0 mmHg.  6. The inferior vena cava is normal in size with greater than 50% respiratory  variability, suggesting right atrial pressure of 3 mmHg. FINDINGS  Left Ventricle: Left ventricular ejection fraction, by estimation, is 65 to 70%. The left ventricle has hyperdynamic function. The left ventricle has no regional wall motion abnormalities. The left ventricular internal cavity size was normal in size. There is no left ventricular hypertrophy. Left ventricular diastolic parameters are consistent with Grade I diastolic dysfunction (impaired relaxation). Right Ventricle: The right ventricular size is normal. No increase in right ventricular wall thickness. Right ventricular systolic function is normal. Tricuspid regurgitation signal is inadequate for assessing PA pressure. Left Atrium: Left atrial size was normal in size. Right Atrium: Right atrial size was normal in size. Pericardium: A moderately sized pericardial effusion is present. Mitral Valve: The mitral valve is normal in structure. There is mild calcification of the mitral valve leaflet(s). Moderate mitral annular calcification. No evidence of mitral valve regurgitation. No evidence of mitral valve stenosis. Tricuspid Valve: The tricuspid valve is normal in structure. Tricuspid valve regurgitation is not demonstrated. Aortic Valve: The aortic valve is tricuspid. Aortic valve regurgitation is mild. Aortic regurgitation PHT measures 224 msec. Moderate aortic stenosis is present. Aortic valve mean gradient measures 22.0 mmHg. Aortic valve peak gradient measures 35.9 mmHg. Aortic valve area, by VTI measures 1.15 cm. Pulmonic Valve: The pulmonic valve was normal in structure. Pulmonic valve regurgitation  is not visualized. Aorta: The aortic root is normal in size and structure. Venous: The inferior vena cava is normal in size with greater than 50% respiratory variability, suggesting right atrial pressure of 3 mmHg. IAS/Shunts: No atrial level shunt detected by color flow Doppler.  LEFT VENTRICLE PLAX 2D LVIDd:         4.20 cm  Diastology LVIDs:          3.00 cm  LV e' medial:  6.09 cm/s LV PW:         0.80 cm  LV e' lateral: 7.51 cm/s LV IVS:        0.80 cm LVOT diam:     1.80 cm LV SV:         50 LV SV Index:   33 LVOT Area:     2.54 cm  RIGHT VENTRICLE             IVC RV S prime:     16.20 cm/s  IVC diam: 1.10 cm TAPSE (M-mode): 2.3 cm LEFT ATRIUM             Index       RIGHT ATRIUM          Index LA diam:        2.90 cm 1.90 cm/m  RA Area:     8.20 cm LA Vol (A2C):   26.4 ml 17.30 ml/m RA Volume:   15.60 ml 10.22 ml/m LA Vol (A4C):   30.6 ml 20.05 ml/m LA Biplane Vol: 29.7 ml 19.46 ml/m  AORTIC VALVE AV Area (Vmax):    0.99 cm AV Area (Vmean):   1.06 cm AV Area (VTI):     1.15 cm AV Vmax:           299.50 cm/s AV Vmean:          203.500 cm/s AV VTI:            0.435 m AV Peak Grad:      35.9 mmHg AV Mean Grad:      22.0 mmHg LVOT Vmax:         116.50 cm/s LVOT Vmean:        84.600 cm/s LVOT VTI:          0.197 m LVOT/AV VTI ratio: 0.45 AI PHT:            224 msec  AORTA Ao Root diam: 2.80 cm  SHUNTS Systemic VTI:  0.20 m Systemic Diam: 1.80 cm Loralie Champagne MD Electronically signed by Loralie Champagne MD Signature Date/Time: 08/01/2020/3:14:23 PM    Final    CT HEAD CODE STROKE WO CONTRAST  Addendum Date: 07/31/2020   ADDENDUM REPORT: 07/31/2020 19:46 ADDENDUM: Dictation error in second point of impression. Possible acute infarct at junction of left superior and precentral gyri is consistent with right-sided symptoms. Electronically Signed   By: Macy Mis M.D.   On: 07/31/2020 19:46   Result Date: 07/31/2020 CLINICAL DATA:  Code stroke.  Right-sided weakness EXAM: CT HEAD WITHOUT CONTRAST TECHNIQUE: Contiguous axial images were obtained from the base of the skull through the vertex without intravenous contrast. COMPARISON:  None. FINDINGS: Significant streak artifact through the skull base and inferior posterior fossa due to dental amalgam. Brain: There is no acute intracranial hemorrhage. Low-density lesion of the left middle frontal  gyrus measures up to 1.5 cm coronally. Additional right middle frontal gyrus low-density lesion measures about 9 mm. Question of small area of loss of gray-white differentiation at  the junction of superior and precentral gyri (series 3, image 29). Prominence of the ventricles and sulci reflects generalized parenchymal volume loss. No extra-axial collection or hydrocephalus. Vascular: No hyperdense vessel. There is mild intracranial atherosclerotic calcification at the skull base. Skull: Unremarkable Sinuses/Orbits: Evidence of prior sinonasal surgery. Small right maxillary sinus air-fluid level. No acute abnormality of the orbits. Other: Mastoid air cells are clear. ASPECTS [facility] Stroke Program Early CT Score) - Ganglionic level infarction (caudate, lentiform nuclei, internal capsule, insula, M1-M3 cortex): 7 - Supraganglionic infarction (M4-M6 cortex): 2 Total score (0-10 with 10 being normal): 9 IMPRESSION: No acute intracranial hemorrhage. Possible small area of acute infarction at the junction of left superior and precentral gyri. However, this is contralateral to expected location for right-sided symptoms. Bilateral frontal hypodense lesions, which could reflect areas of encephalomalacia. However, there is no significant associated volume loss and metastatic disease is a consideration given apparent progression on recent cross-sectional imaging. These results were communicated to Dr. Leonel Ramsay at 1:41 pm on 07/31/2020 by text page via the [facility] messaging system. Electronically Signed: By: Macy Mis M.D. On: 07/31/2020 14:13     The results of significant diagnostics from this hospitalization (including imaging, microbiology, ancillary and laboratory) are listed below for reference.     Microbiology: Recent Results (from the past 240 hour(s))  Respiratory Panel by RT PCR (Flu A&B, Covid) - Nasopharyngeal Swab     Status: None   Collection Time: 07/31/20  4:39 PM   Specimen: Nasopharyngeal  Swab  Result Value Ref Range Status   SARS Coronavirus 2 by RT PCR NEGATIVE NEGATIVE Final    Comment: (NOTE) SARS-CoV-2 target nucleic acids are NOT DETECTED.  The SARS-CoV-2 RNA is generally detectable in upper respiratoy specimens during the acute phase of infection. The lowest concentration of SARS-CoV-2 viral copies this assay can detect is 131 copies/mL. A negative result does not preclude SARS-Cov-2 infection and should not be used as the sole basis for treatment or other patient management decisions. A negative result may occur with  improper specimen collection/handling, submission of specimen other than nasopharyngeal swab, presence of viral mutation(s) within the areas targeted by this assay, and inadequate number of viral copies (<131 copies/mL). A negative result must be combined with clinical observations, patient history, and epidemiological information. The expected result is Negative.  Fact Sheet for Patients:  PinkCheek.be  Fact Sheet for Healthcare Providers:  GravelBags.it  This test is no t yet approved or cleared by the [place] FDA and  has been authorized for detection and/or diagnosis of SARS-CoV-2 by FDA under an Emergency Use Authorization (EUA). This EUA will remain  in effect (meaning this test can be used) for the duration of the COVID-19 declaration under Section 564(b)(1) of the Act, 21 U.S.C. section 360bbb-3(b)(1), unless the authorization is terminated or revoked sooner.     Influenza A by PCR NEGATIVE NEGATIVE Final   Influenza B by PCR NEGATIVE NEGATIVE Final    Comment: (NOTE) The Xpert Xpress SARS-CoV-2/FLU/RSV assay is intended as an aid in  the diagnosis of influenza from Nasopharyngeal swab specimens and  should not be used as a sole basis for treatment. Nasal washings and  aspirates are unacceptable for Xpert Xpress SARS-CoV-2/FLU/RSV  testing.  Fact Sheet for  Patients: PinkCheek.be  Fact Sheet for Healthcare Providers: GravelBags.it  This test is not yet approved or cleared by the [place] FDA and  has been authorized for detection and/or diagnosis of SARS-CoV-2 by  FDA under an Emergency  Use Authorization (EUA). This EUA will remain  in effect (meaning this test can be used) for the duration of the  Covid-19 declaration under Section 564(b)(1) of the Act, 21  U.S.C. section 360bbb-3(b)(1), unless the authorization is  terminated or revoked. Performed at [facility], [place] [address] [place], [place] [address]   SARS Coronavirus 2 by RT PCR (hospital order, performed in [facility] hospital lab) Nasopharyngeal Nasopharyngeal Swab     Status: None   Collection Time: 08/06/20  1:48 AM   Specimen: Nasopharyngeal Swab  Result Value Ref Range Status   SARS Coronavirus 2 NEGATIVE NEGATIVE Final    Comment: (NOTE) SARS-CoV-2 target nucleic acids are NOT DETECTED.  The SARS-CoV-2 RNA is generally detectable in upper and lower respiratory specimens during the acute phase of infection. The lowest concentration of SARS-CoV-2 viral copies this assay can detect is 250 copies / mL. A negative result does not preclude SARS-CoV-2 infection and should not be used as the sole basis for treatment or other patient management decisions.  A negative result may occur with improper specimen collection / handling, submission of specimen other than nasopharyngeal swab, presence of viral mutation(s) within the areas targeted by this assay, and inadequate number of viral copies (<250 copies / mL). A negative result must be combined with clinical observations, patient history, and epidemiological information.  Fact Sheet for Patients:   StrictlyIdeas.no  Fact Sheet for Healthcare Providers: BankingDealers.co.za  This test is not yet approved or   cleared by the [place] FDA and has been authorized for detection and/or diagnosis of SARS-CoV-2 by FDA under an Emergency Use Authorization (EUA).  This EUA will remain in effect (meaning this test can be used) for the duration of the COVID-19 declaration under Section 564(b)(1) of the Act, 21 U.S.C. section 360bbb-3(b)(1), unless the authorization is terminated or revoked sooner.  Performed at [facility], [place] [address], [place], [place] [address]      Labs: BNP (last 3 results) No results for input(s): BNP in the last 8760 hours. Basic Metabolic Panel: Recent Labs  Lab 07/31/20 1323 07/31/20 1327 08/04/20 0221 08/05/20 0149 08/06/20 0425  NA 137 136 140 141 140  K 4.4 4.6 3.2* 4.1 3.8  CL 102 103 109 107 104  CO2 19*  --  26 27 26   GLUCOSE 235* 235* 129* 166* 193*  BUN 31* 39* 13 11 13   CREATININE 1.38* 1.20 0.87 0.88 0.89  CALCIUM 9.5  --  8.6* 8.9 8.8*  MG  --   --  1.8 2.0 1.8   Liver Function Tests: Recent Labs  Lab 07/31/20 1323  AST 36  ALT 20  ALKPHOS 176*  BILITOT 1.5*  PROT 6.0*  ALBUMIN 3.2*   No results for input(s): LIPASE, AMYLASE in the last 168 hours. No results for input(s): AMMONIA in the last 168 hours. CBC: Recent Labs  Lab 07/31/20 1323 07/31/20 1327 08/02/20 0244 08/03/20 0051 08/04/20 0221 08/05/20 0149 08/06/20 0425  WBC 23.6*   < > 18.6* 18.3* 14.1* 16.4* 17.3*  NEUTROABS 18.5*  --   --   --   --   --   --   HGB 11.3*   < > 10.6* 9.3* 8.7* 10.2* 10.2*  HCT 34.3*   < > 32.2* 28.4* 27.1* 31.4* 30.9*  MCV 87.3   < > 85.6 86.3 89.1 88.7 89.6  PLT 294   < > 228 214 231 267 235   < > = values  in this interval not displayed.   Cardiac Enzymes: No results for input(s): CKTOTAL, CKMB, CKMBINDEX, TROPONINI in the last 168 hours. BNP: Invalid input(s): POCBNP CBG: Recent Labs  Lab 08/05/20 2006 08/05/20 2327 08/06/20 0317 08/06/20 0354 08/06/20 0755  GLUCAP 226* 125* 60* 100* 199*   D-Dimer No results for  input(s): DDIMER in the last 72 hours. Hgb A1c No results for input(s): HGBA1C in the last 72 hours. Lipid Profile No results for input(s): CHOL, HDL, LDLCALC, TRIG, CHOLHDL, LDLDIRECT in the last 72 hours. Thyroid function studies No results for input(s): TSH, T4TOTAL, T3FREE, THYROIDAB in the last 72 hours.  Invalid input(s): FREET3 Anemia work up No results for input(s): VITAMINB12, FOLATE, FERRITIN, TIBC, IRON, RETICCTPCT in the last 72 hours. Urinalysis    Component Value Date/Time   COLORURINE YELLOW 07/31/2020 1459   APPEARANCEUR CLEAR 07/31/2020 1459   LABSPEC 1.034 (H) 07/31/2020 1459   PHURINE 6.0 07/31/2020 1459   GLUCOSEU 50 (A) 07/31/2020 1459   GLUCOSEU NEGATIVE 05/04/2017 1622   HGBUR MODERATE (A) 07/31/2020 1459   BILIRUBINUR NEGATIVE 07/31/2020 1459   KETONESUR 20 (A) 07/31/2020 1459   PROTEINUR 30 (A) 07/31/2020 1459   UROBILINOGEN 0.2 05/04/2017 1622   NITRITE NEGATIVE 07/31/2020 1459   LEUKOCYTESUR NEGATIVE 07/31/2020 1459   Sepsis Labs Invalid input(s): PROCALCITONIN,  WBC,  LACTICIDVEN Microbiology Recent Results (from the past 240 hour(s))  Respiratory Panel by RT PCR (Flu A&B, Covid) - Nasopharyngeal Swab     Status: None   Collection Time: 07/31/20  4:39 PM   Specimen: Nasopharyngeal Swab  Result Value Ref Range Status   SARS Coronavirus 2 by RT PCR NEGATIVE NEGATIVE Final    Comment: (NOTE) SARS-CoV-2 target nucleic acids are NOT DETECTED.  The SARS-CoV-2 RNA is generally detectable in upper respiratoy specimens during the acute phase of infection. The lowest concentration of SARS-CoV-2 viral copies this assay can detect is 131 copies/mL. A negative result does not preclude SARS-Cov-2 infection and should not be used as the sole basis for treatment or other patient management decisions. A negative result may occur with  improper specimen collection/handling, submission of specimen other than nasopharyngeal swab, presence of viral mutation(s)  within the areas targeted by this assay, and inadequate number of viral copies (<131 copies/mL). A negative result must be combined with clinical observations, patient history, and epidemiological information. The expected result is Negative.  Fact Sheet for Patients:  PinkCheek.be  Fact Sheet for Healthcare Providers:  GravelBags.it  This test is no t yet approved or cleared by the [place] FDA and  has been authorized for detection and/or diagnosis of SARS-CoV-2 by FDA under an Emergency Use Authorization (EUA). This EUA will remain  in effect (meaning this test can be used) for the duration of the COVID-19 declaration under Section 564(b)(1) of the Act, 21 U.S.C. section 360bbb-3(b)(1), unless the authorization is terminated or revoked sooner.     Influenza A by PCR NEGATIVE NEGATIVE Final   Influenza B by PCR NEGATIVE NEGATIVE Final    Comment: (NOTE) The Xpert Xpress SARS-CoV-2/FLU/RSV assay is intended as an aid in  the diagnosis of influenza from Nasopharyngeal swab specimens and  should not be used as a sole basis for treatment. Nasal washings and  aspirates are unacceptable for Xpert Xpress SARS-CoV-2/FLU/RSV  testing.  Fact Sheet for Patients: PinkCheek.be  Fact Sheet for Healthcare Providers: GravelBags.it  This test is not yet approved or cleared by the [place] FDA and  has been authorized for detection  and/or diagnosis of SARS-CoV-2 by  FDA under an Emergency Use Authorization (EUA). This EUA will remain  in effect (meaning this test can be used) for the duration of the  Covid-19 declaration under Section 564(b)(1) of the Act, 21  U.S.C. section 360bbb-3(b)(1), unless the authorization is  terminated or revoked. Performed at [facility], [place] [address] [place], [place] [address]   SARS Coronavirus 2 by RT PCR (hospital order,  performed in [facility] hospital lab) Nasopharyngeal Nasopharyngeal Swab     Status: None   Collection Time: 08/06/20  1:48 AM   Specimen: Nasopharyngeal Swab  Result Value Ref Range Status   SARS Coronavirus 2 NEGATIVE NEGATIVE Final    Comment: (NOTE) SARS-CoV-2 target nucleic acids are NOT DETECTED.  The SARS-CoV-2 RNA is generally detectable in upper and lower respiratory specimens during the acute phase of infection. The lowest concentration of SARS-CoV-2 viral copies this assay can detect is 250 copies / mL. A negative result does not preclude SARS-CoV-2 infection and should not be used as the sole basis for treatment or other patient management decisions.  A negative result may occur with improper specimen collection / handling, submission of specimen other than nasopharyngeal swab, presence of viral mutation(s) within the areas targeted by this assay, and inadequate number of viral copies (<250 copies / mL). A negative result must be combined with clinical observations, patient history, and epidemiological information.  Fact Sheet for Patients:   StrictlyIdeas.no  Fact Sheet for Healthcare Providers: BankingDealers.co.za  This test is not yet approved or  cleared by the [place] FDA and has been authorized for detection and/or diagnosis of SARS-CoV-2 by FDA under an Emergency Use Authorization (EUA).  This EUA will remain in effect (meaning this test can be used) for the duration of the COVID-19 declaration under Section 564(b)(1) of the Act, 21 U.S.C. section 360bbb-3(b)(1), unless the authorization is terminated or revoked sooner.  Performed at [facility], [place] [address], [place], [place] [address]      Time coordinating discharge:  I have spent 35 minutes face to face with the patient and on the ward discussing the patients care, assessment, plan and disposition with other care givers. >50% of the time was  devoted counseling the patient about the risks and benefits of treatment/Discharge disposition and coordinating care.   SIGNED:   Damita Lack, MD  [facility] 08/06/2020, 8:17 AM   If 7PM-7AM, please contact night-coverage

## 2020-08-05 NOTE — TOC Progression Note (Addendum)
Transition of Care [facility]) - Progression Note    Patient Details  Name: Jonathan Miranda MRN: [id number] Date of Birth: 16-Jul-1951  Transition of Care [facility]) CM/SW Contact  Jonathan Chars, LCSW Phone Number: 08/05/2020, 11:58 AM  Clinical Narrative:  CSW spoke with pt and [name].  Pt is vaccinated for covid.  Notes reference preference for [facility], who has not offered bed.  Neither pt or sister reports preference for [facility].  Sister is Media planner and chooses [facility].  Auth submitted to [place].  CSW spoke to pt who prefers [facility].  Pt and sister spoke by phone and want to change choice to [facility].  Jonathan Miranda can accept to tomorrow, sister to go at 45 for paperwork.  [facility] notified of change.  1505: auth received from [place]: 3 days, 11/1-11/3, Jonathan Miranda NM#[id number], reviewer is Jonathan Miranda, fax to [phone].    Expected Discharge Plan: [place] Barriers to Discharge: Continued Medical Work up  Expected Discharge Plan and Services Expected Discharge Plan: [place] In-house Referral: Clinical Social Work Discharge Planning Services: CM Consult Post Acute Care Choice: [place] arrangements for the past 2 months: Single Family Home Expected Discharge Date: 08/05/20                                     Social Determinants of Health (SDOH) Interventions    Readmission Risk Interventions No flowsheet data found.

## 2020-08-06 LAB — BASIC METABOLIC PANEL
Anion gap: 10 (ref 5–15)
BUN: 13 mg/dL (ref 8–23)
CO2: 26 mmol/L (ref 22–32)
Calcium: 8.8 mg/dL — ABNORMAL LOW (ref 8.9–10.3)
Chloride: 104 mmol/L (ref 98–111)
Creatinine, Ser: 0.89 mg/dL (ref 0.61–1.24)
GFR, Estimated: >60 mL/min (ref >60)
Glucose, Bld: 193 mg/dL — ABNORMAL HIGH (ref 70–99)
Potassium: 3.8 mmol/L (ref 3.5–5.1)
Sodium: 140 mmol/L (ref 135–145)

## 2020-08-06 LAB — GLUCOSE, CAPILLARY
Glucose-Capillary: 60 mg/dL — ABNORMAL LOW (ref 70–99)
Glucose-Capillary: 100 mg/dL — ABNORMAL HIGH (ref 70–99)
Glucose-Capillary: 199 mg/dL — ABNORMAL HIGH (ref 70–99)
Glucose-Capillary: 219 mg/dL — ABNORMAL HIGH (ref 70–99)

## 2020-08-06 LAB — CBC
HCT: 30.9 % — ABNORMAL LOW (ref 39.0–52.0)
Hemoglobin: 10.2 g/dL — ABNORMAL LOW (ref 13.0–17.0)
MCH: 29.6 pg (ref 26.0–34.0)
MCHC: 33 g/dL (ref 30.0–36.0)
MCV: 89.6 fL (ref 80.0–100.0)
Platelets: 235 10*3/uL (ref 150–400)
RBC: 3.45 MIL/uL — ABNORMAL LOW (ref 4.22–5.81)
RDW: 17.3 % — ABNORMAL HIGH (ref 11.5–15.5)
WBC: 17.3 10*3/uL — ABNORMAL HIGH (ref 4.0–10.5)
nRBC: 0.1 % (ref 0.0–0.2)

## 2020-08-06 LAB — SARS CORONAVIRUS 2 BY RT PCR (HOSPITAL ORDER, PERFORMED IN ~~LOC~~ HOSPITAL LAB): SARS Coronavirus 2: NEGATIVE

## 2020-08-06 LAB — MAGNESIUM: Magnesium: 1.8 mg/dL (ref 1.7–2.4)

## 2020-08-06 NOTE — Progress Notes (Addendum)
Inpatient Diabetes Program Recommendations  AACE/ADA: New Consensus Statement on Inpatient Glycemic Control (2015)  Target Ranges:  Prepandial:   less than 140 mg/dL      Peak postprandial:   less than 180 mg/dL (1-2 hours)      Critically ill patients:  140 - 180 mg/dL   Lab Results  Component Value Date   GLUCAP 199 (H) 08/06/2020   HGBA1C 8.1 (H) 07/31/2020    Review of Glycemic Control Results for RAEGAN, SIPP (MRN [id number]) as of 08/06/2020 09:58  Ref. Range 08/05/2020 20:06 08/05/2020 23:27 08/06/2020 03:17 08/06/2020 03:54 08/06/2020 07:55  Glucose-Capillary Latest Ref Range: 70 - 99 mg/dL 226 (H) 125 (H) 60 (L) 100 (H) 199 (H)   Current orders for Inpatient glycemic control:  Lantus 5 units daily Novolog 0-9 units q4h  Inpatient Diabetes Program Recommendations:    Please consider Novolog 0-9 units tid with meals and 0-5 units qhs to avoid insulin stacking and hypoglycemia.  Will continue to follow while inpatient.  Thank you, Reche Dixon, RN, BSN Diabetes Coordinator Inpatient Diabetes Program [phone] (team pager from 8a-5p)

## 2020-08-06 NOTE — Care Management Important Message (Signed)
Important Message  Patient Details  Name: STEWARD SAMES MRN: [id number] Date of Birth: 07-18-1951   Medicare Important Message Given:  Yes     Deylan Canterbury [address] 08/06/2020, 4:00 PM

## 2020-08-06 NOTE — Progress Notes (Signed)
Patient discharged to [facility] via EMS. VS stable, PIV removed. Attempted to call [facility] to give receiving nurse report x2.

## 2020-08-06 NOTE — Progress Notes (Addendum)
Patient has an available bed at SNF. No covid test has been ordered. Spoke to B. Chotiner MD requesting order for covid test prior to discharge to SNF. MD was unwilling to order covid test, stated it was not necessary. I advised it is a requirement to go to a SNF. Charge nurse spoke to MD as well.   Received call back from B. Chotiner MD, he advised he ordered the covid test.

## 2020-08-06 NOTE — TOC Transition Note (Signed)
Transition of Care [facility]) - CM/SW Discharge Note   Patient Details  Name: Jonathan Miranda MRN: [id number] Date of Birth: 05/02/1951  [facility] CM/SW Contact:  Joanne Chars, LCSW Phone Number: 08/06/2020, 12:46 PM   Clinical Narrative:  Pt discharging to [facility], room 3234.  RN call report to [phone].     Final next level of care: Skilled Nursing Facility Barriers to Discharge: Barriers Resolved   Patient Goals and CMS Choice   CMS Medicare.gov Compare Post Acute Care list provided to:: Patient Represenative (must comment) Choice offered to / list presented to : Sibling  Discharge Placement              Patient chooses bed at: [facility] Patient to be transferred to facility by: [place] Name of family member notified: [name], sister Patient and family notified of of transfer: 08/06/20  Discharge Plan and Services In-house Referral: Clinical Social Work Discharge Planning Services: [employer] Consult Post Acute Care Choice: [place]                               Social Determinants of Health (SDOH) Interventions     Readmission Risk Interventions No flowsheet data found.

## 2020-08-06 NOTE — Progress Notes (Signed)
Hypoglycemic Event  CBG: 60    Treatment: 4 oz juice/soda  Symptoms: None  Follow-up CBG: [id number] CBG Result:100  Possible Reasons for Event: Inadequate meal intake  Comments/MD notified:yes, B. Chotiner MD    Jonathan Miranda

## 2020-08-06 NOTE — Progress Notes (Signed)
No acute events overnight, vital signs are stable.  Discharge summary completed 11/1 and updated today.  Stable for discharge to rehab.  Call with questions as needed.  Gerlean Ren MD [facility]

## 2020-09-04 DEATH — deceased

## 2021-02-06 IMAGING — MR MR HEAD W/O CM
4 series · 48 of 48 positions shown · non-contrast
Comparison: None.

CLINICAL DATA: Code stroke follow-up

EXAM:
MRI HEAD WITHOUT CONTRAST
TECHNIQUE: Multiplanar, multiecho pulse sequences of the brain and surrounding
structures were obtained without intravenous contrast.

[Series 4: DWI · axial · 3.0mm · 1.09mm/px · z∈[-36,+111]mm · 19 of 102 slices shown (1 of 4)]
[im 1/102]
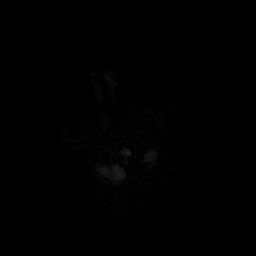
[im 6/102]
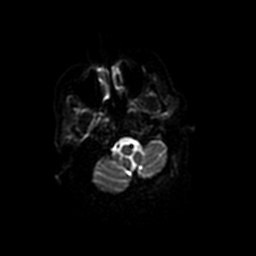
[im 12/102]
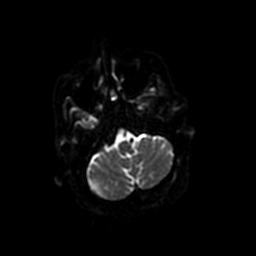
[im 17/102]
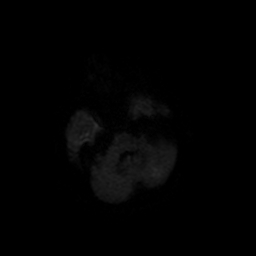
[im 23/102]
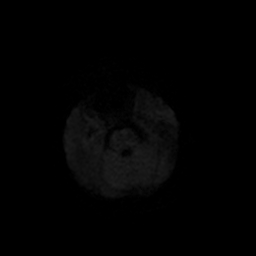
[im 29/102]
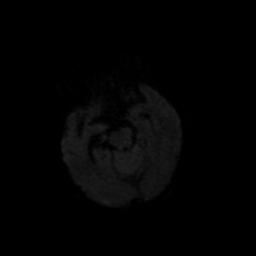
[im 34/102]
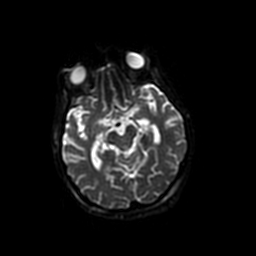
[im 40/102]
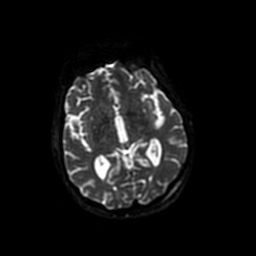
[im 45/102]
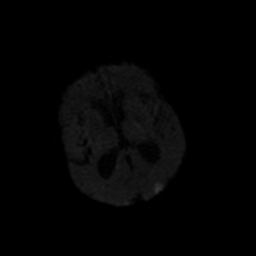
[im 51/102]
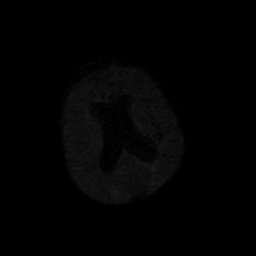
[im 57/102]
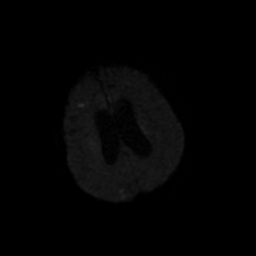
[im 62/102]
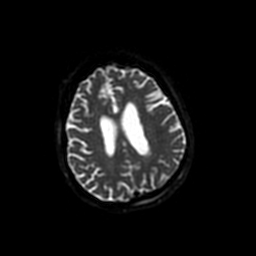
[im 68/102]
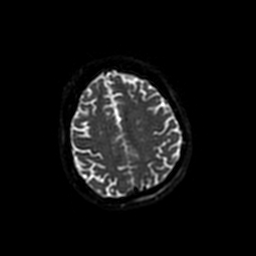
[im 73/102]
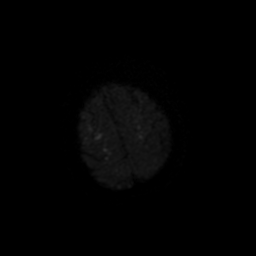
[im 79/102]
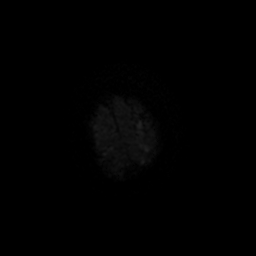
[im 85/102]
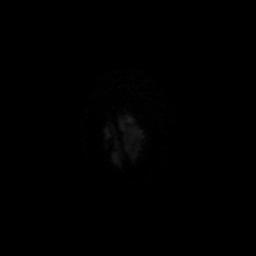
[im 90/102]
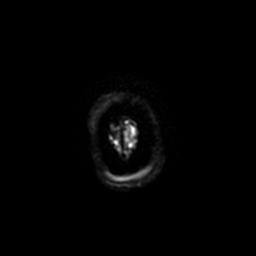
[im 96/102]
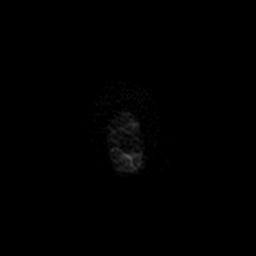
[im 102/102]
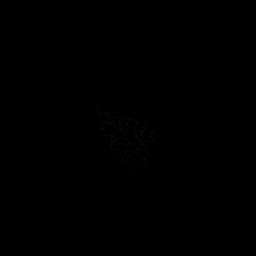

[Series 5: DWI · coronal · 5.0mm · 1.09mm/px · 13 of 68 slices shown (2 of 4)]
[im 1/68]
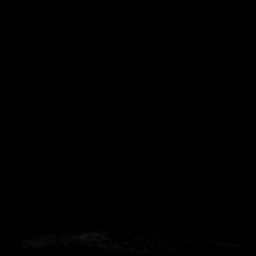
[im 6/68]
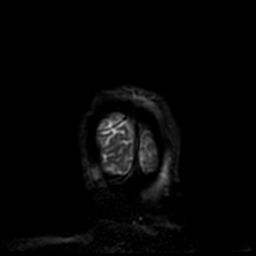
[im 12/68]
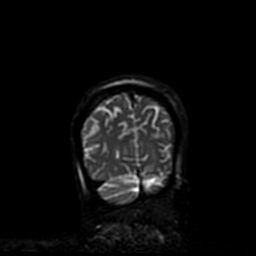
[im 17/68]
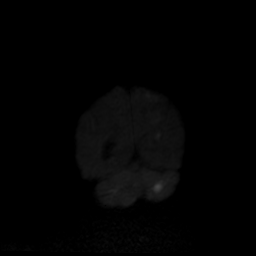
[im 23/68]
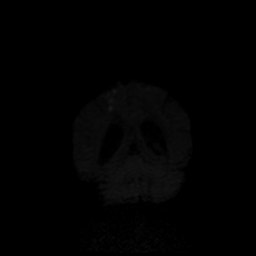
[im 28/68]
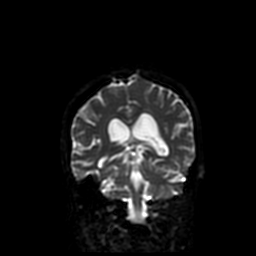
[im 34/68]
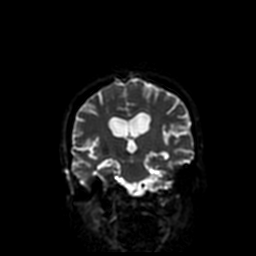
[im 40/68]
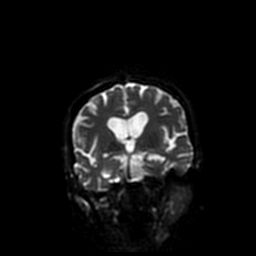
[im 45/68]
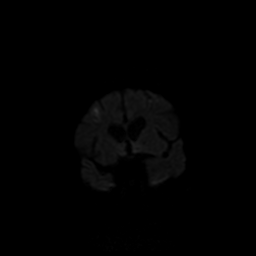
[im 51/68]
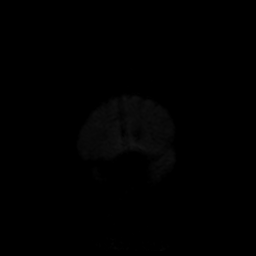
[im 56/68]
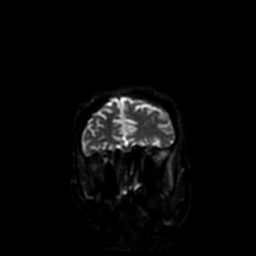
[im 62/68]
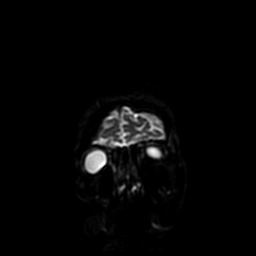
[im 68/68]
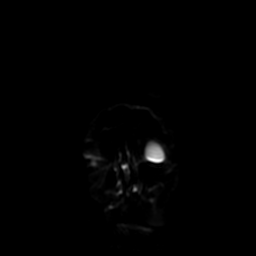

[Series 400: DWI · axial · 3.0mm · 1.09mm/px · z∈[-36,+111]mm · 10 of 51 slices shown (3 of 4)]
[im 1/51]
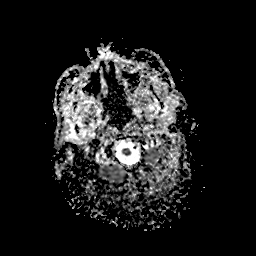
[im 6/51]
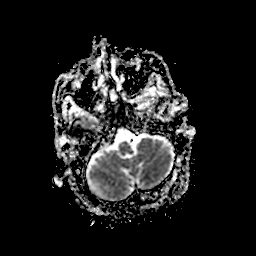
[im 12/51]
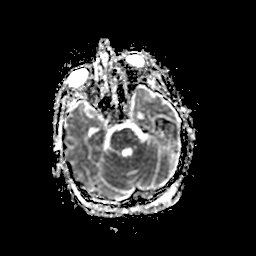
[im 17/51]
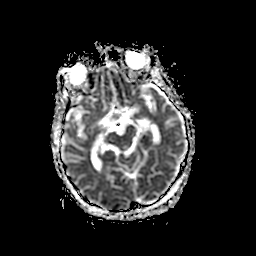
[im 23/51]
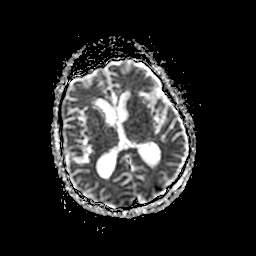
[im 28/51]
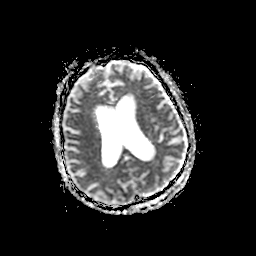
[im 34/51]
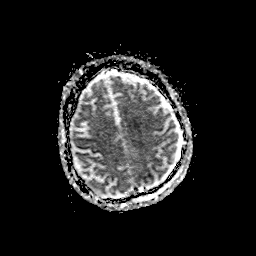
[im 39/51]
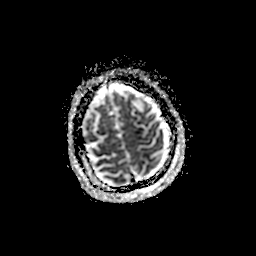
[im 45/51]
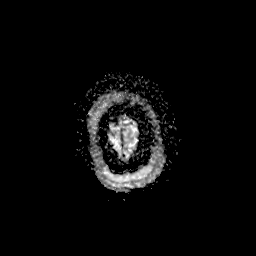
[im 51/51]
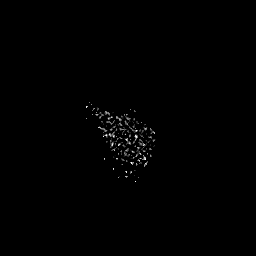

[Series 500: DWI · coronal · 5.0mm · 1.09mm/px · 6 of 34 slices shown (4 of 4)]
[im 1/34]
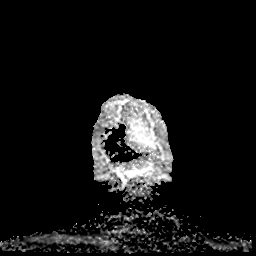
[im 7/34]
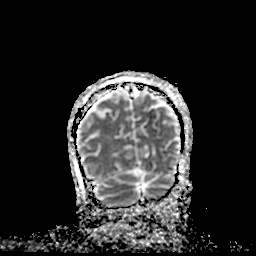
[im 14/34]
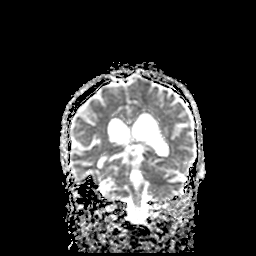
[im 20/34]
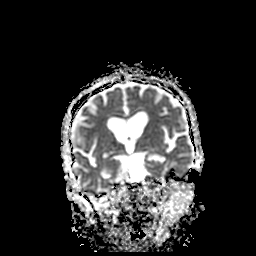
[im 27/34]
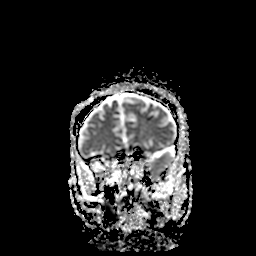
[im 34/34]
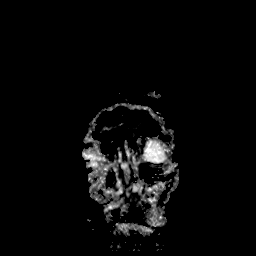

[48 of 48 positions shown; findings below may reference images not displayed]

FINDINGS: Diffusion-weighted imaging was obtained. Patient could not tolerate
remainder of study. Significant motion artifact is present.

There are numerous small foci of reduced diffusion involving
bilateral frontoparietal and occipital lobes as well as the
cerebellum.
IMPRESSION: Single sequence study with significant motion degradation. Multiple
small acute infarcts involving bilateral vascular territories.

## 2021-02-06 IMAGING — CT CT HEAD CODE STROKE
3 of 4 series · 13 of 47 positions shown, 15 images · non-contrast
Comparison: None.
COMPARISON: None.

Addendum:
CLINICAL DATA: Code stroke.  Right-sided weakness

EXAM:
CT HEAD WITHOUT CONTRAST
TECHNIQUE: Contiguous axial images were obtained from the base of the skull
through the vertex without intravenous contrast.

[Series 3: head wo · axial · 0.41mm/px · z∈[-94,+36]mm · 7 of 36 slices shown, 9 images]
[im 5/36  brain]
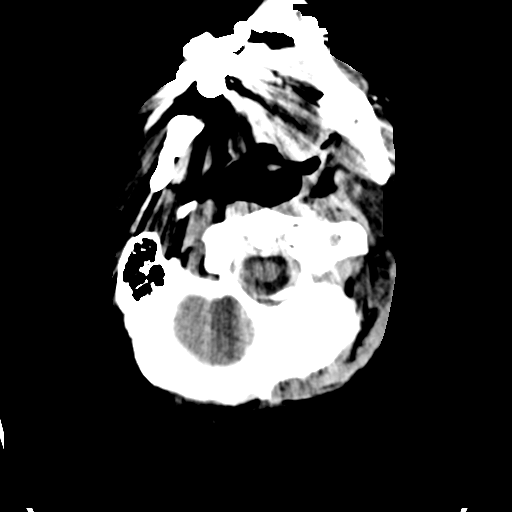
[im 5/36  bone]
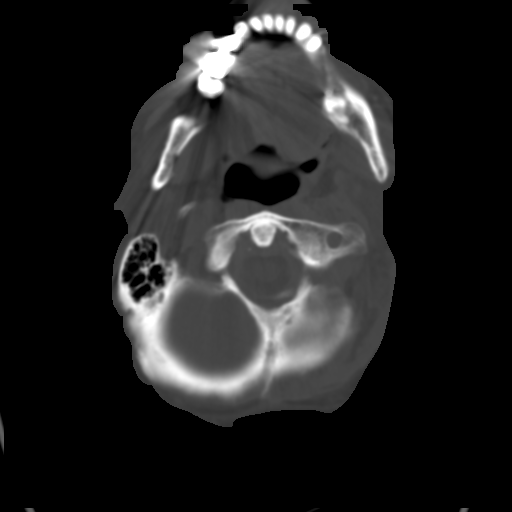
[im 9/36  brain]
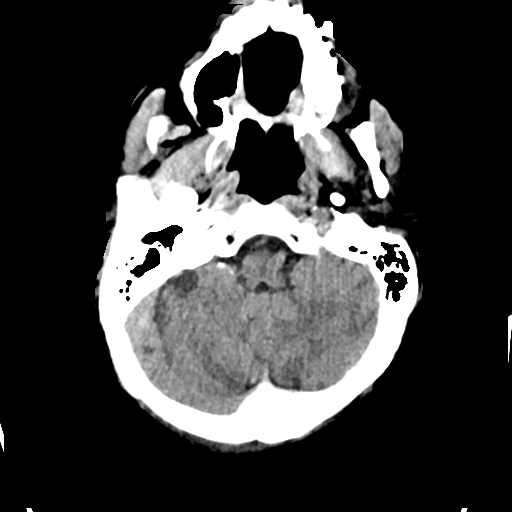
[im 14/36  brain]
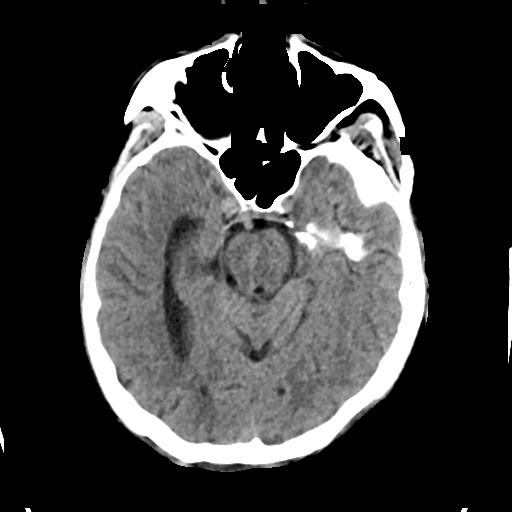
[im 18/36  brain]
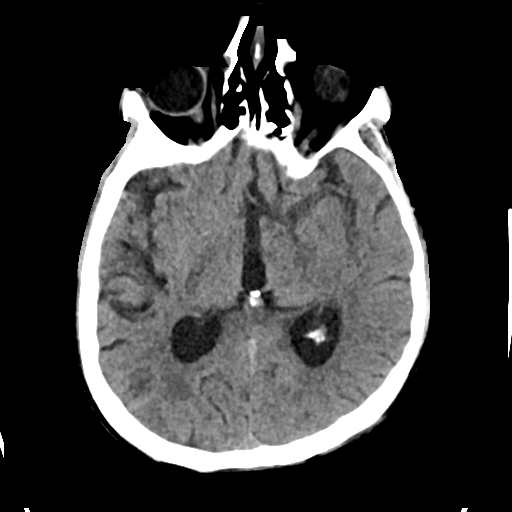
[im 22/36  brain]
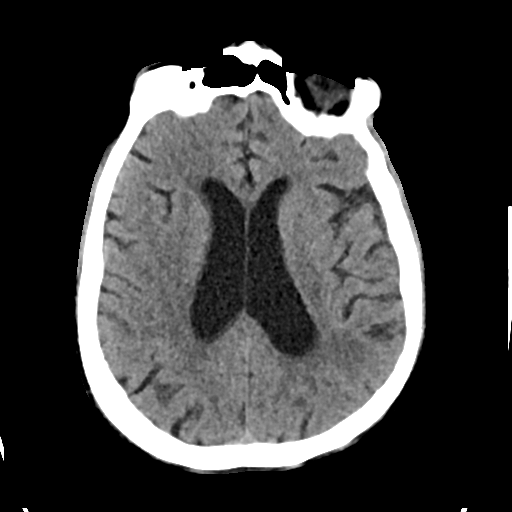
[im 22/36  bone]
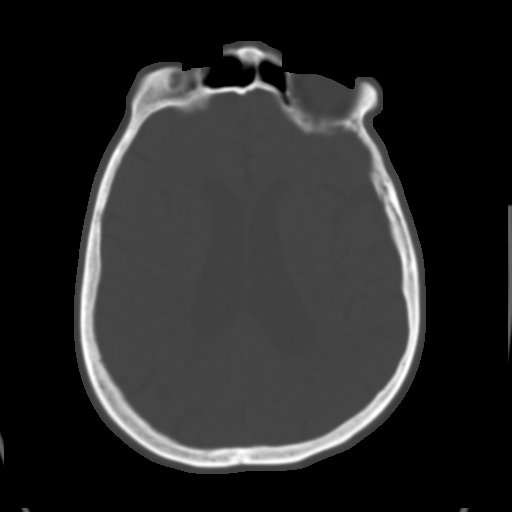
[im 27/36  brain]
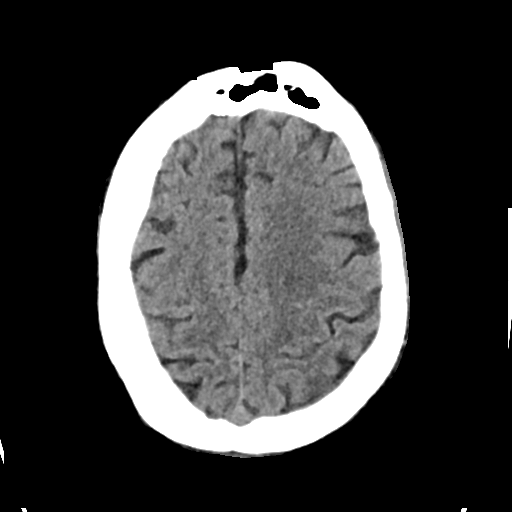
[im 31/36  brain]
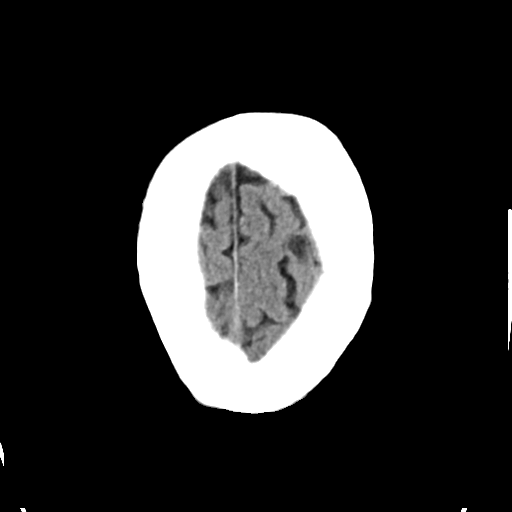

[Series 5: cor soft · coronal · 0.33mm/px · 3 of 69 slices shown]
[im 24/69  brain]
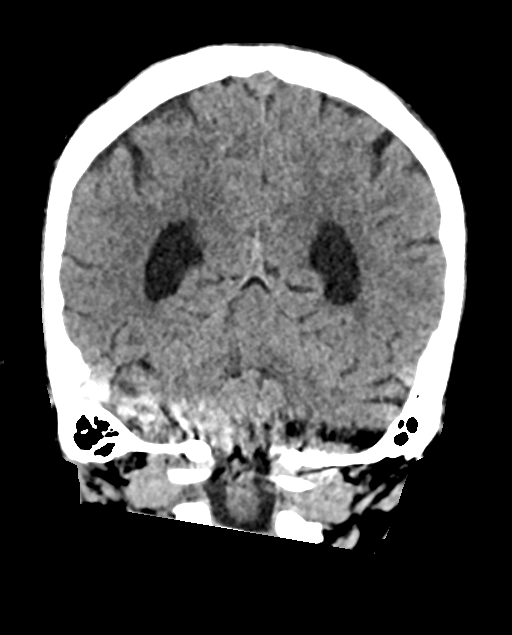
[im 31/69  brain]
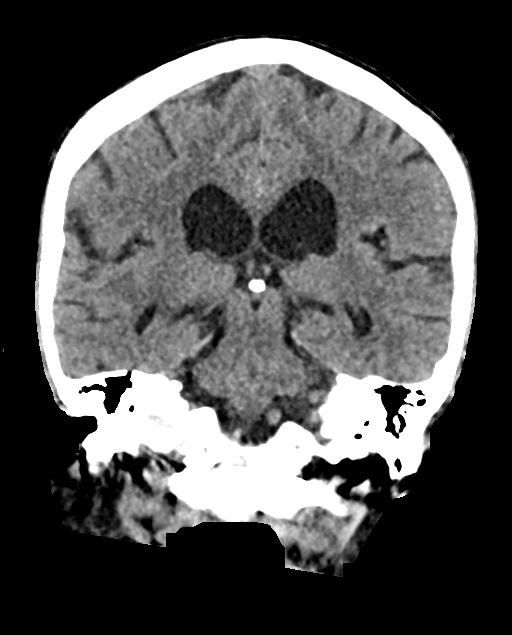
[im 38/69  brain]
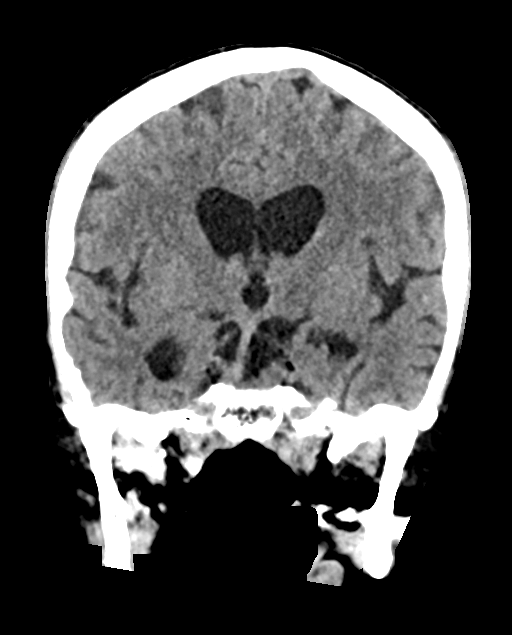

[Series 6: sag soft · sagittal · 0.41mm/px · 3 of 57 slices shown]
[im 19/57  brain]
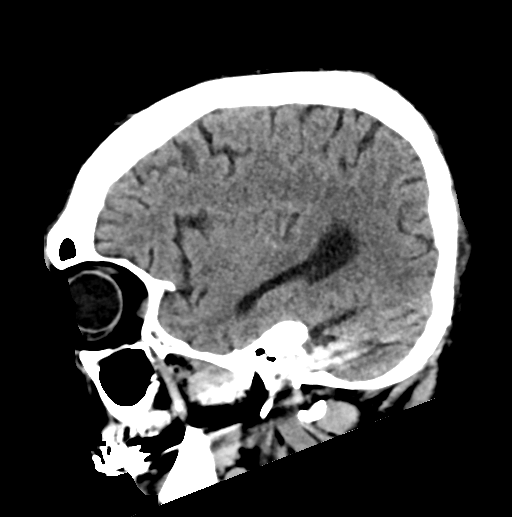
[im 29/57  brain]
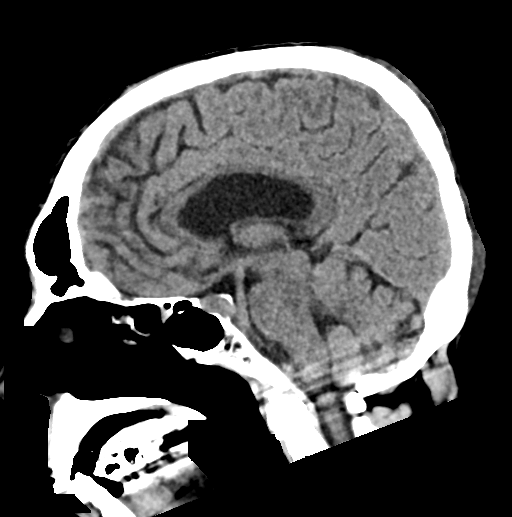
[im 38/57  brain]
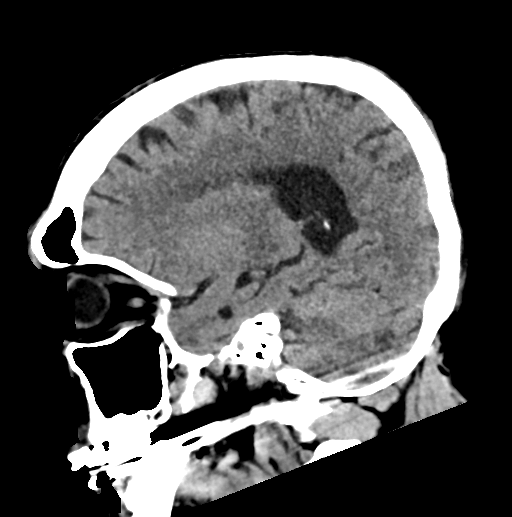

[13 of 47 positions shown; findings below may reference images not displayed]

FINDINGS: Significant streak artifact through the skull base and inferior
posterior fossa due to dental amalgam.

Brain: There is no acute intracranial hemorrhage. Low-density lesion
of the left middle frontal gyrus measures up to 1.5 cm coronally.
Additional right middle frontal gyrus low-density lesion measures
about 9 mm. Question of small area of loss of gray-white
differentiation at the junction of superior and precentral gyri
(series 3, image 29).

Prominence of the ventricles and sulci reflects generalized
parenchymal volume loss. No extra-axial collection or hydrocephalus.

Vascular: No hyperdense vessel. There is mild intracranial
atherosclerotic calcification at the skull base.

Skull: Unremarkable

Sinuses/Orbits: Evidence of prior sinonasal surgery. Small right
maxillary sinus air-fluid level. No acute abnormality of the orbits.

Other: Mastoid air cells are clear.

ASPECTS ([place] Stroke Program Early CT Score)

- Ganglionic level infarction (caudate, lentiform nuclei, internal
capsule, insula, M1-M3 cortex): 7

- Supraganglionic infarction (M4-M6 cortex): 2

Total score (0-10 with 10 being normal): 9
IMPRESSION: No acute intracranial hemorrhage.

Possible small area of acute infarction at the junction of left
superior and precentral gyri. However, this is contralateral to
expected location for right-sided symptoms.

Bilateral frontal hypodense lesions, which could reflect areas of
encephalomalacia. However, there is no significant associated volume
loss and metastatic disease is a consideration given apparent
progression on recent cross-sectional imaging.

These results were communicated to Dr. Suluka at [DATE] on
07/31/2020 by text page via the [employer] messaging system.

ADDENDUM:
Dictation error in second point of impression. Possible acute
infarct at junction of left superior and precentral gyri is
consistent with right-sided symptoms.

*** End of Addendum ***
FINDINGS: Significant streak artifact through the skull base and inferior
posterior fossa due to dental amalgam.

Brain: There is no acute intracranial hemorrhage. Low-density lesion
of the left middle frontal gyrus measures up to 1.5 cm coronally.
Additional right middle frontal gyrus low-density lesion measures
about 9 mm. Question of small area of loss of gray-white
differentiation at the junction of superior and precentral gyri
(series 3, image 29).

Prominence of the ventricles and sulci reflects generalized
parenchymal volume loss. No extra-axial collection or hydrocephalus.

Vascular: No hyperdense vessel. There is mild intracranial
atherosclerotic calcification at the skull base.

Skull: Unremarkable

Sinuses/Orbits: Evidence of prior sinonasal surgery. Small right
maxillary sinus air-fluid level. No acute abnormality of the orbits.

Other: Mastoid air cells are clear.

ASPECTS (Alberta Stroke Program Early CT Score)

- Ganglionic level infarction (caudate, lentiform nuclei, internal
capsule, insula, M1-M3 cortex): 7

- Supraganglionic infarction (M4-M6 cortex): 2

Total score (0-10 with 10 being normal): 9
IMPRESSION: No acute intracranial hemorrhage.

Possible small area of acute infarction at the junction of left
superior and precentral gyri. However, this is contralateral to
expected location for right-sided symptoms.

Bilateral frontal hypodense lesions, which could reflect areas of
encephalomalacia. However, there is no significant associated volume
loss and metastatic disease is a consideration given apparent
progression on recent cross-sectional imaging.

These results were communicated to Dr. Suluka at [DATE] on
07/31/2020 by text page via the [employer] messaging system.
